# Patient Record
Sex: Male | Born: 1980 | Race: White | Hispanic: No | Marital: Single | Smoking: Never smoker
Health system: Southern US, Community
[De-identification: ages and names within clinical notes are randomized; demographics above are authoritative.]

## PROBLEM LIST (undated history)

## (undated) DIAGNOSIS — K589 Irritable bowel syndrome without diarrhea: Secondary | ICD-10-CM

## (undated) HISTORY — PX: COLONOSCOPY: SHX174

## (undated) HISTORY — PX: WISDOM TOOTH EXTRACTION: SHX21

## (undated) HISTORY — DX: Irritable bowel syndrome, unspecified: K58.9

---

## 2015-02-06 ENCOUNTER — Ambulatory Visit (INDEPENDENT_AMBULATORY_CARE_PROVIDER_SITE_OTHER): Payer: Worker's Compensation | Admitting: Emergency Medicine

## 2015-02-06 VITALS — BP 140/90 | HR 107 | Temp 99.0°F | Resp 16 | Ht 74.0 in | Wt 298.0 lb

## 2015-02-06 DIAGNOSIS — S61011A Laceration without foreign body of right thumb without damage to nail, initial encounter: Secondary | ICD-10-CM

## 2015-02-06 DIAGNOSIS — M79644 Pain in right finger(s): Secondary | ICD-10-CM

## 2015-02-06 NOTE — Progress Notes (Signed)
I directly supervised and participated in the laceration repair.  Verbal consent obtained from patient.  Local anesthesia with 4cc Lidocaine 2% without epinephrine.  Wound scrubbed with soap and water and rinsed.  Wound closed with #2 4-0 Prolene simple interrupted sutures.  Wound cleansed and dressed.

## 2015-02-06 NOTE — Progress Notes (Signed)
   Subjective:  Patient ID: Joseph Lucero, male    DOB: March 18, 1981  Age: 34 y.o. MRN: [id number]  CC: Work Related Injury   HPI Dujuan Stankowski presents  with a laceration to his right thumb that he sustained at work. He is not current on tetanus has no other injury. Has moderate pain in his thumb.  History  Past medical family and social history are unremarkable  Review of Systems   Review of systems noncontributory  Objective:  BP 140/90 mmHg  Pulse 107  Temp(Src) 99 F (37.2 C) (Oral)  Resp 16  Ht  (1.88 m)  Wt 298 lb (135.172 kg)  BMI 38.24 kg/m2  SpO2 99%  Physical Exam  Constitutional: He is oriented to person, place, and time. He appears well-developed and well-nourished. No distress.  HENT:  Head: Normocephalic and atraumatic.  Right Ear: External ear normal.  Left Ear: External ear normal.  Nose: Nose normal.  Eyes: Conjunctivae and EOM are normal. Pupils are equal, round, and reactive to light. No scleral icterus.  Neck: Normal range of motion. Neck supple. No tracheal deviation present.  Cardiovascular: Normal rate, regular rhythm and normal heart sounds.   Pulmonary/Chest: Effort normal. No respiratory distress. He has no wheezes. He has no rales.  Abdominal: He exhibits no mass. There is no tenderness. There is no rebound and no guarding.  Musculoskeletal: He exhibits no edema.  Lymphadenopathy:    He has no cervical adenopathy.  Neurological: He is alert and oriented to person, place, and time. Coordination normal.  Skin: Skin is warm and dry. No rash noted.  Psychiatric: He has a normal mood and affect. His behavior is normal.      Assessment & Plan:   Burhanuddin was seen today for work related injury.  Diagnoses and all orders for this visit:  Laceration of thumb, right, initial encounter  Thumb pain, right  Other orders -     Tdap vaccine greater than or equal to 7yo IM   I am having Mr. Vorndran maintain his LORazepam.  Meds ordered this  encounter  Medications  . LORazepam (ATIVAN) 0.5 MG tablet    Sig: Take 0.5 mg by mouth every 8 (eight) hours.    Appropriate red flag conditions were discussed with the patient as well as actions that should be taken.  Patient expressed his understanding.  Follow-up: Return in about 1 week (around 02/13/2015).  Carmelina Dane, MD

## 2015-02-06 NOTE — Progress Notes (Signed)
Laceration Repair  Verbal consent obtained. Digital block on right thumb with 4 cc 2% lidocaine without epinephrine. Wound scrubbed with soap and water. Wound closed with two simple interrupted 4-0 prolene sutures. Would cleansed and dressed.

## 2015-02-06 NOTE — Patient Instructions (Addendum)
Tdap Vaccine (Tetanus, Diphtheria, Pertussis): What You Need to Know 1. Why get vaccinated? Tetanus, diphtheria and pertussis can be very serious diseases, even for adolescents and adults. Tdap vaccine can protect us from these diseases. TETANUS (Lockjaw) causes painful muscle tightening and stiffness, usually all over the body.  It can lead to tightening of muscles in the head and neck so you can't open your mouth, swallow, or sometimes even breathe. Tetanus kills about 1 out of 5 people who are infected. DIPHTHERIA can cause a thick coating to form in the back of the throat.  It can lead to breathing problems, paralysis, heart failure, and death. PERTUSSIS (Whooping Cough) causes severe coughing spells, which can cause difficulty breathing, vomiting and disturbed sleep.  It can also lead to weight loss, incontinence, and rib fractures. Up to 2 in 100 adolescents and 5 in 100 adults with pertussis are hospitalized or have complications, which could include pneumonia or death. These diseases are caused by bacteria. Diphtheria and pertussis are spread from person to person through coughing or sneezing. Tetanus enters the body through cuts, scratches, or wounds. Before vaccines, the [place] saw as many as 200,000 cases a year of diphtheria and pertussis, and hundreds of cases of tetanus. Since vaccination began, tetanus and diphtheria have dropped by about 99% and pertussis by about 80%. 2. Tdap vaccine Tdap vaccine can protect adolescents and adults from tetanus, diphtheria, and pertussis. One dose of Tdap is routinely given at age 11 or 12. People who did not get Tdap at that age should get it as soon as possible. Tdap is especially important for health care professionals and anyone having close contact with a baby younger than 12 months. Pregnant women should get a dose of Tdap during every pregnancy, to protect the newborn from pertussis. Infants are most at risk for severe, life-threatening  complications from pertussis. A similar vaccine, called Td, protects from tetanus and diphtheria, but not pertussis. A Td booster should be given every 10 years. Tdap may be given as one of these boosters if you have not already gotten a dose. Tdap may also be given after a severe cut or burn to prevent tetanus infection. Your doctor can give you more information. Tdap may safely be given at the same time as other vaccines. 3. Some people should not get this vaccine  If you ever had a life-threatening allergic reaction after a dose of any tetanus, diphtheria, or pertussis containing vaccine, OR if you have a severe allergy to any part of this vaccine, you should not get Tdap. Tell your doctor if you have any severe allergies.  If you had a coma, or long or multiple seizures within 7 days after a childhood dose of DTP or DTaP, you should not get Tdap, unless a cause other than the vaccine was found. You can still get Td.  Talk to your doctor if you:  have epilepsy or another nervous system problem,  had severe pain or swelling after any vaccine containing diphtheria, tetanus or pertussis,  ever had Guillain-Barr Syndrome (GBS),  aren't feeling well on the day the shot is scheduled. 4. Risks of a vaccine reaction With any medicine, including vaccines, there is a chance of side effects. These are usually mild and go away on their own, but serious reactions are also possible. Brief fainting spells can follow a vaccination, leading to injuries from falling. Sitting or lying down for about 15 minutes can help prevent these. Tell your doctor if you feel   dizzy or light-headed, or have vision changes or ringing in the ears. Mild problems following Tdap (Did not interfere with activities)  Pain where the shot was given (about 3 in 4 adolescents or 2 in 3 adults)  Redness or swelling where the shot was given (about 1 person in 5)  Mild fever of at least 100.4F (up to about 1 in 25 adolescents or  1 in 100 adults)  Headache (about 3 or 4 people in 10)  Tiredness (about 1 person in 3 or 4)  Nausea, vomiting, diarrhea, stomach ache (up to 1 in 4 adolescents or 1 in 10 adults)  Chills, body aches, sore joints, rash, swollen glands (uncommon) Moderate problems following Tdap (Interfered with activities, but did not require medical attention)  Pain where the shot was given (about 1 in 5 adolescents or 1 in 100 adults)  Redness or swelling where the shot was given (up to about 1 in 16 adolescents or 1 in 25 adults)  Fever over 102F (about 1 in 100 adolescents or 1 in 250 adults)  Headache (about 3 in 20 adolescents or 1 in 10 adults)  Nausea, vomiting, diarrhea, stomach ache (up to 1 or 3 people in 100)  Swelling of the entire arm where the shot was given (up to about 3 in 100). Severe problems following Tdap (Unable to perform usual activities; required medical attention)  Swelling, severe pain, bleeding and redness in the arm where the shot was given (rare). A severe allergic reaction could occur after any vaccine (estimated less than 1 in a million doses). 5. What if there is a serious reaction? What should I look for?  Look for anything that concerns you, such as signs of a severe allergic reaction, very high fever, or behavior changes. Signs of a severe allergic reaction can include hives, swelling of the face and throat, difficulty breathing, a fast heartbeat, dizziness, and weakness. These would start a few minutes to a few hours after the vaccination. What should I do?  If you think it is a severe allergic reaction or other emergency that can't wait, call 9-1-1 or get the person to the nearest hospital. Otherwise, call your doctor.  Afterward, the reaction should be reported to the "Vaccine Adverse Event Reporting System" (VAERS). Your doctor might file this report, or you can do it yourself through the VAERS web site at www.vaers.hhs.gov, or by calling  1-800-822-7967. VAERS is only for reporting reactions. They do not give medical advice.  6. The National Vaccine Injury Compensation Program The National Vaccine Injury Compensation Program (VICP) is a federal program that was created to compensate people who may have been injured by certain vaccines. Persons who believe they may have been injured by a vaccine can learn about the program and about filing a claim by calling 1-800-338-2382 or visiting the VICP website at www.hrsa.gov/vaccinecompensation. 7. How can I learn more?  Ask your doctor.  Call your local or state health department.  Contact the Centers for Disease Control and Prevention (CDC):  Call 1-800-232-4636 or visit CDC's website at www.cdc.gov/vaccines. CDC Tdap Vaccine VIS (10/09/11) Document Released: 11/18/2011 Document Revised: 10/03/2013 Document Reviewed: 08/31/2013 ExitCare Patient Information 2015 [employer]. This information is not intended to replace advice given to you by your health care provider. Make sure you discuss any questions you have with your health care provider. Laceration Care, Adult A laceration is a cut or lesion that goes through all layers of the skin and into the tissue just beneath the   skin. TREATMENT  Some lacerations may not require closure. Some lacerations may not be able to be closed due to an increased risk of infection. It is important to see your caregiver as soon as possible after an injury to minimize the risk of infection and maximize the opportunity for successful closure. If closure is appropriate, pain medicines may be given, if needed. The wound will be cleaned to help prevent infection. Your caregiver will use stitches (sutures), staples, wound glue (adhesive), or skin adhesive strips to repair the laceration. These tools bring the skin edges together to allow for faster healing and a better cosmetic outcome. However, all wounds will heal with a scar. Once the wound has healed,  scarring can be minimized by covering the wound with sunscreen during the day for 1 full year. HOME CARE INSTRUCTIONS  For sutures or staples:  Keep the wound clean and dry.  If you were given a bandage (dressing), you should change it at least once a day. Also, change the dressing if it becomes wet or dirty, or as directed by your caregiver.  Wash the wound with soap and water 2 times a day. Rinse the wound off with water to remove all soap. Pat the wound dry with a clean towel.  After cleaning, apply a thin layer of the antibiotic ointment as recommended by your caregiver. This will help prevent infection and keep the dressing from sticking.  You may shower as usual after the first 24 hours. Do not soak the wound in water until the sutures are removed.  Only take over-the-counter or prescription medicines for pain, discomfort, or fever as directed by your caregiver.  Get your sutures or staples removed as directed by your caregiver. For skin adhesive strips:  Keep the wound clean and dry.  Do not get the skin adhesive strips wet. You may bathe carefully, using caution to keep the wound dry.  If the wound gets wet, pat it dry with a clean towel.  Skin adhesive strips will fall off on their own. You may trim the strips as the wound heals. Do not remove skin adhesive strips that are still stuck to the wound. They will fall off in time. For wound adhesive:  You may briefly wet your wound in the shower or bath. Do not soak or scrub the wound. Do not swim. Avoid periods of heavy perspiration until the skin adhesive has fallen off on its own. After showering or bathing, gently pat the wound dry with a clean towel.  Do not apply liquid medicine, cream medicine, or ointment medicine to your wound while the skin adhesive is in place. This may loosen the film before your wound is healed.  If a dressing is placed over the wound, be careful not to apply tape directly over the skin adhesive. This  may cause the adhesive to be pulled off before the wound is healed.  Avoid prolonged exposure to sunlight or tanning lamps while the skin adhesive is in place. Exposure to ultraviolet light in the first year will darken the scar.  The skin adhesive will usually remain in place for 5 to 10 days, then naturally fall off the skin. Do not pick at the adhesive film. You may need a tetanus shot if:  You cannot remember when you had your last tetanus shot.  You have never had a tetanus shot. If you get a tetanus shot, your arm may swell, get red, and feel warm to the touch. This is common and not   a problem. If you need a tetanus shot and you choose not to have one, there is a rare chance of getting tetanus. Sickness from tetanus can be serious. SEEK MEDICAL CARE IF:   You have redness, swelling, or increasing pain in the wound.  You see a red line that goes away from the wound.  You have yellowish-white fluid (pus) coming from the wound.  You have a fever.  You notice a bad smell coming from the wound or dressing.  Your wound breaks open before or after sutures have been removed.  You notice something coming out of the wound such as wood or glass.  Your wound is on your hand or foot and you cannot move a finger or toe. SEEK IMMEDIATE MEDICAL CARE IF:   Your pain is not controlled with prescribed medicine.  You have severe swelling around the wound causing pain and numbness or a change in color in your arm, hand, leg, or foot.  Your wound splits open and starts bleeding.  You have worsening numbness, weakness, or loss of function of any joint around or beyond the wound.  You develop painful lumps near the wound or on the skin anywhere on your body. MAKE SURE YOU:   Understand these instructions.  Will watch your condition.  Will get help right away if you are not doing well or get worse. Document Released: 05/19/2005 Document Revised: 08/11/2011 Document Reviewed:  11/12/2010 ExitCare Patient Information 2015 [employer]. This information is not intended to replace advice given to you by your health care provider. Make sure you discuss any questions you have with your health care provider.  

## 2015-02-13 ENCOUNTER — Ambulatory Visit (INDEPENDENT_AMBULATORY_CARE_PROVIDER_SITE_OTHER): Payer: Worker's Compensation | Admitting: Family Medicine

## 2015-02-13 VITALS — BP 149/94 | HR 98 | Temp 98.6°F | Resp 16 | Ht 73.0 in | Wt 297.4 lb

## 2015-02-13 DIAGNOSIS — S61011D Laceration without foreign body of right thumb without damage to nail, subsequent encounter: Secondary | ICD-10-CM

## 2015-02-13 NOTE — Patient Instructions (Signed)
Continue to keep your thumb clean and dry, protect against impact Return if you develop redness, swelling or drainage.

## 2015-02-13 NOTE — Progress Notes (Signed)
   Subjective:    Patient ID: Joseph Lucero, male    DOB: March 06, 1981, 34 y.o.   MRN: [id number]  HPI This is a pleasant 34 yo male who presents today for suture removal. He had 2 sutures placed 02/06/15 following a laceration with a razor blade while at work. He has not had any redness, swelling or drainage at the suture site.    Review of Systems Per HPI    Objective:   Physical Exam Physical Exam  Vitals reviewed. Constitutional: Oriented to person, place, and time. Appears well-developed and well-nourished.  HENT:  Head: Normocephalic and atraumatic.  Eyes: Conjunctivae are normal.  Neck: Normal range of motion. Neck supple.  Cardiovascular: Normal rate.   Pulmonary/Chest: Effort normal.  Musculoskeletal: Normal range of motion.  Neurological: Alert and oriented to person, place, and time.  Skin: Skin is warm and dry.  Psychiatric: Normal mood and affect. Behavior is normal. Judgment and thought content normal.   Right thumb- 2 sutures intact- removed. Wound edges well approximated, no erythema, no edema, no drainage.      Assessment & Plan:  1. Laceration of thumb, right, subsequent encounter - sutures removed, site unremarkable, RTC precautions given to patient.    Olean Ree, FNP-BC  [facility], [facility]  02/13/2015 1:56 PM

## 2017-05-13 ENCOUNTER — Emergency Department (HOSPITAL_COMMUNITY): Payer: 59

## 2017-05-13 ENCOUNTER — Encounter (HOSPITAL_COMMUNITY): Payer: Self-pay | Admitting: *Deleted

## 2017-05-13 ENCOUNTER — Encounter (HOSPITAL_COMMUNITY)
Admission: EM | Disposition: A | Discharge status: Home / Self Care | Payer: Self-pay | Source: Home / Self Care | Attending: Emergency Medicine

## 2017-05-13 ENCOUNTER — Emergency Department (HOSPITAL_COMMUNITY): Payer: 59 | Admitting: Registered Nurse

## 2017-05-13 ENCOUNTER — Other Ambulatory Visit: Payer: Self-pay

## 2017-05-13 ENCOUNTER — Observation Stay (HOSPITAL_COMMUNITY)
Admission: EM | Admit: 2017-05-13 | Discharge: 2017-05-14 | Disposition: A | Discharge status: Home / Self Care | Payer: 59 | Attending: Surgery | Admitting: Surgery

## 2017-05-13 DIAGNOSIS — K5730 Diverticulosis of large intestine without perforation or abscess without bleeding: Secondary | ICD-10-CM | POA: Diagnosis not present

## 2017-05-13 DIAGNOSIS — K3533 Acute appendicitis with perforation and localized peritonitis, with abscess: Principal | ICD-10-CM | POA: Insufficient documentation

## 2017-05-13 DIAGNOSIS — K439 Ventral hernia without obstruction or gangrene: Secondary | ICD-10-CM | POA: Diagnosis not present

## 2017-05-13 DIAGNOSIS — K3580 Unspecified acute appendicitis: Secondary | ICD-10-CM | POA: Diagnosis present

## 2017-05-13 DIAGNOSIS — R000 Tachycardia, unspecified: Secondary | ICD-10-CM | POA: Diagnosis not present

## 2017-05-13 DIAGNOSIS — Z9049 Acquired absence of other specified parts of digestive tract: Secondary | ICD-10-CM

## 2017-05-13 DIAGNOSIS — K760 Fatty (change of) liver, not elsewhere classified: Secondary | ICD-10-CM | POA: Diagnosis not present

## 2017-05-13 DIAGNOSIS — Z6838 Body mass index (BMI) 38.0-38.9, adult: Secondary | ICD-10-CM | POA: Insufficient documentation

## 2017-05-13 DIAGNOSIS — K589 Irritable bowel syndrome without diarrhea: Secondary | ICD-10-CM | POA: Insufficient documentation

## 2017-05-13 HISTORY — PX: LAPAROSCOPIC APPENDECTOMY: SHX408

## 2017-05-13 LAB — CBC WITH DIFFERENTIAL/PLATELET
Basophils Absolute: 0 10*3/uL (ref 0.0–0.1)
Basophils Relative: 0 %
EOS ABS: 0 10*3/uL (ref 0.0–0.7)
Eosinophils Relative: 0 %
HCT: 46.3 % (ref 39.0–52.0)
HEMOGLOBIN: 16.4 g/dL (ref 13.0–17.0)
LYMPHS ABS: 1.6 10*3/uL (ref 0.7–4.0)
LYMPHS PCT: 8 %
MCH: 31.7 pg (ref 26.0–34.0)
MCHC: 35.4 g/dL (ref 30.0–36.0)
MCV: 89.4 fL (ref 78.0–100.0)
MONOS PCT: 6 %
Monocytes Absolute: 1.4 10*3/uL — ABNORMAL HIGH (ref 0.1–1.0)
NEUTROS PCT: 86 %
Neutro Abs: 18.4 10*3/uL — ABNORMAL HIGH (ref 1.7–7.7)
Platelets: 286 10*3/uL (ref 150–400)
RBC: 5.18 MIL/uL (ref 4.22–5.81)
RDW: 12.3 % (ref 11.5–15.5)
WBC: 21.4 10*3/uL — ABNORMAL HIGH (ref 4.0–10.5)

## 2017-05-13 LAB — COMPREHENSIVE METABOLIC PANEL
ALK PHOS: 140 U/L — AB (ref 38–126)
ALT: 54 U/L (ref 17–63)
ANION GAP: 10 (ref 5–15)
AST: 31 U/L (ref 15–41)
Albumin: 4.3 g/dL (ref 3.5–5.0)
BILIRUBIN TOTAL: 0.7 mg/dL (ref 0.3–1.2)
BUN: 18 mg/dL (ref 6–20)
CO2: 23 mmol/L (ref 22–32)
CREATININE: 0.84 mg/dL (ref 0.61–1.24)
Calcium: 9.3 mg/dL (ref 8.9–10.3)
Chloride: 103 mmol/L (ref 101–111)
Glucose, Bld: 116 mg/dL — ABNORMAL HIGH (ref 65–99)
Potassium: 3.8 mmol/L (ref 3.5–5.1)
SODIUM: 136 mmol/L (ref 135–145)
Total Protein: 7.7 g/dL (ref 6.5–8.1)

## 2017-05-13 LAB — URINALYSIS, ROUTINE W REFLEX MICROSCOPIC
BILIRUBIN URINE: NEGATIVE
GLUCOSE, UA: NEGATIVE mg/dL
HGB URINE DIPSTICK: NEGATIVE
Ketones, ur: NEGATIVE mg/dL
Leukocytes, UA: NEGATIVE
Nitrite: NEGATIVE
PH: 5 (ref 5.0–8.0)
Protein, ur: NEGATIVE mg/dL

## 2017-05-13 LAB — LIPASE, BLOOD: Lipase: 22 U/L (ref 11–51)

## 2017-05-13 SURGERY — APPENDECTOMY, LAPAROSCOPIC
Anesthesia: General | Site: Abdomen

## 2017-05-13 MED ORDER — ONDANSETRON HCL 4 MG/2ML IJ SOLN
INTRAMUSCULAR | Status: DC | PRN
  Administered 2017-05-13: 4 mg via INTRAVENOUS

## 2017-05-13 MED ORDER — FENTANYL CITRATE (PF) 100 MCG/2ML IJ SOLN: 25.0000 ug | INTRAMUSCULAR | Status: DC | PRN

## 2017-05-13 MED ORDER — HEPARIN SODIUM (PORCINE) 5000 UNIT/ML IJ SOLN
5000.0000 [IU] | Freq: Three times a day (TID) | INTRAMUSCULAR | Status: DC
  Administered 2017-05-13: 5000 [IU] via SUBCUTANEOUS
  Filled 2017-05-13 (×2): qty 1

## 2017-05-13 MED ORDER — ONDANSETRON HCL 4 MG/2ML IJ SOLN: 4.0000 mg | Freq: Four times a day (QID) | INTRAMUSCULAR | Status: DC | PRN

## 2017-05-13 MED ORDER — ENOXAPARIN SODIUM 30 MG/0.3ML ~~LOC~~ SOLN: 30.0000 mg | Freq: Two times a day (BID) | SUBCUTANEOUS | Status: DC

## 2017-05-13 MED ORDER — IOPAMIDOL (ISOVUE-300) INJECTION 61%
100.0000 mL | Freq: Once | INTRAVENOUS | Status: AC | PRN
Start: 2017-05-13 — End: 2017-05-13
  Administered 2017-05-13: 100 mL via INTRAVENOUS

## 2017-05-13 MED ORDER — SUCCINYLCHOLINE CHLORIDE 200 MG/10ML IV SOSY
PREFILLED_SYRINGE | INTRAVENOUS | Status: DC | PRN
  Administered 2017-05-13: 140 mg via INTRAVENOUS

## 2017-05-13 MED ORDER — OXYCODONE HCL 5 MG PO TABS
5.0000 mg | ORAL_TABLET | Freq: Once | ORAL | Status: AC | PRN
  Administered 2017-05-13: 5 mg via ORAL

## 2017-05-13 MED ORDER — LIDOCAINE 2% (20 MG/ML) 5 ML SYRINGE
INTRAMUSCULAR | Status: DC | PRN
  Administered 2017-05-13: 60 mg via INTRAVENOUS

## 2017-05-13 MED ORDER — FENTANYL CITRATE (PF) 250 MCG/5ML IJ SOLN
INTRAMUSCULAR | Status: AC
  Filled 2017-05-13: qty 5

## 2017-05-13 MED ORDER — MORPHINE SULFATE (PF) 2 MG/ML IV SOLN: 2.0000 mg | INTRAVENOUS | Status: DC | PRN

## 2017-05-13 MED ORDER — KCL IN DEXTROSE-NACL 20-5-0.45 MEQ/L-%-% IV SOLN
INTRAVENOUS | Status: DC
  Administered 2017-05-13: 18:00:00 via INTRAVENOUS
  Administered 2017-05-13 – 2017-05-14 (×2): 100 mL/h via INTRAVENOUS
  Filled 2017-05-13 (×2): qty 1000

## 2017-05-13 MED ORDER — ONDANSETRON 4 MG PO TBDP: 4.0000 mg | ORAL_TABLET | Freq: Four times a day (QID) | ORAL | Status: DC | PRN

## 2017-05-13 MED ORDER — ROCURONIUM BROMIDE 10 MG/ML (PF) SYRINGE
PREFILLED_SYRINGE | INTRAVENOUS | Status: DC | PRN
  Administered 2017-05-13: 5 mg via INTRAVENOUS
  Administered 2017-05-13: 40 mg via INTRAVENOUS

## 2017-05-13 MED ORDER — IOPAMIDOL (ISOVUE-300) INJECTION 61%
INTRAVENOUS | Status: AC
  Filled 2017-05-13: qty 100

## 2017-05-13 MED ORDER — PIPERACILLIN-TAZOBACTAM 3.375 G IVPB
3.3750 g | Freq: Three times a day (TID) | INTRAVENOUS | Status: DC
  Filled 2017-05-13: qty 50

## 2017-05-13 MED ORDER — PROPOFOL 10 MG/ML IV BOLUS
INTRAVENOUS | Status: DC | PRN
  Administered 2017-05-13: 200 mg via INTRAVENOUS

## 2017-05-13 MED ORDER — MIDAZOLAM HCL 2 MG/2ML IJ SOLN
INTRAMUSCULAR | Status: AC
  Filled 2017-05-13: qty 2

## 2017-05-13 MED ORDER — KCL IN DEXTROSE-NACL 20-5-0.45 MEQ/L-%-% IV SOLN
INTRAVENOUS | Status: AC
  Filled 2017-05-13: qty 1000

## 2017-05-13 MED ORDER — OXYCODONE HCL 5 MG PO TABS
ORAL_TABLET | ORAL | Status: AC
  Filled 2017-05-13: qty 1

## 2017-05-13 MED ORDER — SUGAMMADEX SODIUM 200 MG/2ML IV SOLN
INTRAVENOUS | Status: DC | PRN
  Administered 2017-05-13: 200 mg via INTRAVENOUS

## 2017-05-13 MED ORDER — HYDROCODONE-ACETAMINOPHEN 5-325 MG PO TABS
1.0000 | ORAL_TABLET | ORAL | Status: DC | PRN
  Administered 2017-05-13 (×2): 1 via ORAL
  Filled 2017-05-13: qty 2

## 2017-05-13 MED ORDER — SODIUM CHLORIDE 0.9 % IV BOLUS (SEPSIS)
1000.0000 mL | Freq: Once | INTRAVENOUS | Status: AC
  Administered 2017-05-13: 1000 mL via INTRAVENOUS

## 2017-05-13 MED ORDER — MORPHINE SULFATE (PF) 4 MG/ML IV SOLN
4.0000 mg | Freq: Once | INTRAVENOUS | Status: AC
  Administered 2017-05-13: 4 mg via INTRAVENOUS
  Filled 2017-05-13: qty 1

## 2017-05-13 MED ORDER — DIPHENHYDRAMINE HCL 50 MG/ML IJ SOLN: 25.0000 mg | Freq: Four times a day (QID) | INTRAMUSCULAR | Status: DC | PRN

## 2017-05-13 MED ORDER — PIPERACILLIN-TAZOBACTAM 3.375 G IVPB 30 MIN
3.3750 g | Freq: Three times a day (TID) | INTRAVENOUS | Status: AC
  Administered 2017-05-13: 3.375 g via INTRAVENOUS
  Filled 2017-05-13 (×2): qty 50

## 2017-05-13 MED ORDER — FENTANYL CITRATE (PF) 250 MCG/5ML IJ SOLN
INTRAMUSCULAR | Status: DC | PRN
  Administered 2017-05-13: 75 ug via INTRAVENOUS
  Administered 2017-05-13: 50 ug via INTRAVENOUS
  Administered 2017-05-13: 100 ug via INTRAVENOUS
  Administered 2017-05-13: 25 ug via INTRAVENOUS

## 2017-05-13 MED ORDER — DIPHENHYDRAMINE HCL 25 MG PO CAPS: 25.0000 mg | ORAL_CAPSULE | Freq: Four times a day (QID) | ORAL | Status: DC | PRN

## 2017-05-13 MED ORDER — PROPOFOL 10 MG/ML IV BOLUS
INTRAVENOUS | Status: AC
  Filled 2017-05-13: qty 20

## 2017-05-13 MED ORDER — OXYCODONE HCL 5 MG/5ML PO SOLN
5.0000 mg | Freq: Once | ORAL | Status: AC | PRN
  Filled 2017-05-13: qty 5

## 2017-05-13 MED ORDER — POTASSIUM CHLORIDE 2 MEQ/ML IV SOLN
INTRAVENOUS | Status: DC
  Filled 2017-05-13: qty 1000

## 2017-05-13 MED ORDER — DEXAMETHASONE SODIUM PHOSPHATE 10 MG/ML IJ SOLN
INTRAMUSCULAR | Status: DC | PRN
  Administered 2017-05-13: 10 mg via INTRAVENOUS

## 2017-05-13 MED ORDER — RINGERS IRRIGATION IR SOLN
Status: DC | PRN
  Administered 2017-05-13: 1000 mL

## 2017-05-13 MED ORDER — PIPERACILLIN-TAZOBACTAM 3.375 G IVPB 30 MIN
3.3750 g | Freq: Once | INTRAVENOUS | Status: AC
  Administered 2017-05-13: 3.375 g via INTRAVENOUS
  Filled 2017-05-13: qty 50

## 2017-05-13 MED ORDER — SODIUM CHLORIDE 0.9 % IR SOLN
Status: DC | PRN
  Administered 2017-05-13: 1000 mL

## 2017-05-13 MED ORDER — MIDAZOLAM HCL 2 MG/2ML IJ SOLN
INTRAMUSCULAR | Status: DC | PRN
  Administered 2017-05-13: 2 mg via INTRAVENOUS

## 2017-05-13 MED ORDER — BUPIVACAINE LIPOSOME 1.3 % IJ SUSP
20.0000 mL | Freq: Once | INTRAMUSCULAR | Status: AC
  Administered 2017-05-13: 20 mL
  Filled 2017-05-13: qty 20

## 2017-05-13 MED ORDER — LACTATED RINGERS IV SOLN
INTRAVENOUS | Status: DC | PRN
  Administered 2017-05-13 (×2): via INTRAVENOUS

## 2017-05-13 SURGICAL SUPPLY — 36 items
APPLIER CLIP ROT 10 11.4 M/L (STAPLE)
CABLE HIGH FREQUENCY MONO STRZ (ELECTRODE) IMPLANT
CHLORAPREP W/TINT 26ML (MISCELLANEOUS) IMPLANT
CLIP APPLIE ROT 10 11.4 M/L (STAPLE) IMPLANT
COVER SURGICAL LIGHT HANDLE (MISCELLANEOUS) ×3 IMPLANT
CUTTER FLEX LINEAR 45M (STAPLE) ×3 IMPLANT
DECANTER SPIKE VIAL GLASS SM (MISCELLANEOUS) IMPLANT
DERMABOND ADVANCED (GAUZE/BANDAGES/DRESSINGS) ×2
DERMABOND ADVANCED .7 DNX12 (GAUZE/BANDAGES/DRESSINGS) ×1 IMPLANT
DRAPE LAPAROSCOPIC ABDOMINAL (DRAPES) ×3 IMPLANT
ELECT REM PT RETURN 15FT ADLT (MISCELLANEOUS) ×3 IMPLANT
ENDOLOOP SUT PDS II  0 18 (SUTURE)
ENDOLOOP SUT PDS II 0 18 (SUTURE) IMPLANT
GLOVE BIOGEL M 8.0 STRL (GLOVE) ×3 IMPLANT
GOWN STRL REUS W/TWL XL LVL3 (GOWN DISPOSABLE) ×6 IMPLANT
KIT BASIN OR (CUSTOM PROCEDURE TRAY) ×3 IMPLANT
PAD POSITIONING PINK XL (MISCELLANEOUS) ×3 IMPLANT
POUCH RETRIEVAL ECOSAC 10 (ENDOMECHANICALS) ×1 IMPLANT
POUCH RETRIEVAL ECOSAC 10MM (ENDOMECHANICALS) ×2
POUCH SPECIMEN RETRIEVAL 10MM (ENDOMECHANICALS) IMPLANT
RELOAD 45 VASCULAR/THIN (ENDOMECHANICALS) ×3 IMPLANT
RELOAD STAPLE TA45 3.5 REG BLU (ENDOMECHANICALS) IMPLANT
SCISSORS LAP 5X45 EPIX DISP (ENDOMECHANICALS) IMPLANT
SET IRRIG TUBING LAPAROSCOPIC (IRRIGATION / IRRIGATOR) ×3 IMPLANT
SHEARS HARMONIC ACE PLUS 45CM (MISCELLANEOUS) ×3 IMPLANT
SLEEVE XCEL OPT CAN 5 100 (ENDOMECHANICALS) IMPLANT
STAPLER VISISTAT 35W (STAPLE) IMPLANT
SUT MNCRL AB 4-0 PS2 18 (SUTURE) ×3 IMPLANT
TOWEL OR 17X26 10 PK STRL BLUE (TOWEL DISPOSABLE) ×3 IMPLANT
TRAY FOLEY W/METER SILVER 14FR (SET/KITS/TRAYS/PACK) ×3 IMPLANT
TRAY FOLEY W/METER SILVER 16FR (SET/KITS/TRAYS/PACK) ×3 IMPLANT
TRAY LAPAROSCOPIC (CUSTOM PROCEDURE TRAY) ×3 IMPLANT
TROCAR BLADELESS OPT 5 100 (ENDOMECHANICALS) ×3 IMPLANT
TROCAR XCEL BLUNT TIP 100MML (ENDOMECHANICALS) ×3 IMPLANT
TROCAR XCEL NON-BLD 11X100MML (ENDOMECHANICALS) IMPLANT
TUBING INSUF HEATED (TUBING) ×3 IMPLANT

## 2017-05-13 NOTE — Discharge Instructions (Signed)
Laparoscopic Appendectomy, Adult, Care After °Refer to this sheet in the next few weeks. These instructions provide you with information about caring for yourself after your procedure. Your health care provider may also give you more specific instructions. Your treatment has been planned according to current medical practices, but problems sometimes occur. Call your health care provider if you have any problems or questions after your procedure. °What can I expect after the procedure? °After the procedure, it is common to have: °· A decrease in your energy level. °· Mild pain in the area where the surgical cuts (incisions) were made. °· Constipation. This can be caused by pain medicine and a decrease in your activity. ° °Follow these instructions at home: °Medicines °· Take over-the-counter and prescription medicines only as told by your health care provider. °· Do not drive for 24 hours if you received a sedative. °· Do not drive or operate heavy machinery while taking prescription pain medicine. °· If you were prescribed an antibiotic medicine, take it as told by your health care provider. Do not stop taking the antibiotic even if you start to feel better. °Activity °· For 3 weeks or as long as told by your health care provider: °? Do not lift anything that is heavier than 10 pounds (4.5 kg). °? Do not play contact sports. °· Gradually return to your normal activities. Ask your health care provider what activities are safe for you. °Bathing °· Keep your incisions clean and dry. Clean them as often as told by your health care provider: °? Gently wash the incisions with soap and water. °? Rinse the incisions with water to remove all soap. °? Pat the incisions dry with a clean towel. Do not rub the incisions. °· You may take showers after 48 hours. °· Do not take baths, swim, or use hot tubs for 2 weeks or as told by your health care provider. °Incision care °· Follow instructions from your healthcare provider about  how to take care of your incisions. Make sure you: °? Wash your hands with soap and water before you change your bandage (dressing). If soap and water are not available, use hand sanitizer. °? Change your dressing as told by your health care provider. °? Leave stitches (sutures), skin glue, or adhesive strips in place. These skin closures may need to stay in place for 2 weeks or longer. If adhesive strip edges start to loosen and curl up, you may trim the loose edges. Do not remove adhesive strips completely unless your health care provider tells you to do that. °· Check your incision areas every day for signs of infection. Check for: °? More redness, swelling, or pain. °? More fluid or blood. °? Warmth. °? Pus or a bad smell. °Other Instructions °· If you were sent home with a drain, follow instructions from your health care provider about how to care for the drain and how to empty it. °· Take deep breaths. This helps to prevent your lungs from becoming inflamed. °· To relieve and prevent constipation: °? Drink plenty of fluids. °? Eat plenty of fruits and vegetables. °· Keep all follow-up visits as told by your health care provider. This is important. °Contact a health care provider if: °· You have more redness, swelling, or pain around an incision. °· You have more fluid or blood coming from an incision. °· Your incision feels warm to the touch. °· You have pus or a bad smell coming from an incision or dressing. °· Your incision   edges break open after your sutures have been removed. °· You have increasing pain in your shoulders. °· You feel dizzy or you faint. °· You develop shortness of breath. °· You keep feeling nauseous or vomiting. °· You have diarrhea or you cannot control your bowel functions. °· You lose your appetite. °· You develop swelling or pain in your legs. °Get help right away if: °· You have a fever. °· You develop a rash. °· You have difficulty breathing. °· You have sharp pains in your  chest. °This information is not intended to replace advice given to you by your health care provider. Make sure you discuss any questions you have with your health care provider. °Document Released: 05/19/2005 Document Revised: 10/19/2015 Document Reviewed: 11/06/2014 °Elsevier Interactive Patient Education © 2017 [employer] ° °CCS ______[facility] °LAPAROSCOPIC SURGERY: POST OP INSTRUCTIONS °Always review your discharge instruction sheet given to you by the facility where your surgery was performed. °IF YOU HAVE DISABILITY OR FAMILY LEAVE FORMS, YOU MUST BRING THEM TO THE OFFICE FOR PROCESSING.   °DO NOT GIVE THEM TO YOUR DOCTOR. ° °1. A prescription for pain medication may be given to you upon discharge.  Take your pain medication as prescribed, if needed.  If narcotic pain medicine is not needed, then you may take acetaminophen (Tylenol) or ibuprofen (Advil) as needed. °2. Take your usually prescribed medications unless otherwise directed. °3. If you need a refill on your pain medication, please contact your pharmacy.  They will contact our office to request authorization. Prescriptions will not be filled after 5pm or on week-ends. °4. You should follow a light diet the first few days after arrival home, such as soup and crackers, etc.  Be sure to include lots of fluids daily. °5. Most patients will experience some swelling and bruising in the area of the incisions.  Ice packs will help.  Swelling and bruising can take several days to resolve.  °6. It is common to experience some constipation if taking pain medication after surgery.  Increasing fluid intake and taking a stool softener (such as Colace) will usually help or prevent this problem from occurring.  A mild laxative (Milk of Magnesia or Miralax) should be taken according to package instructions if there are no bowel movements after 48 hours. °7. Unless discharge instructions indicate otherwise, you may remove your bandages 24-48  hours after surgery, and you may shower at that time.  You may have steri-strips (small skin tapes) in place directly over the incision.  These strips should be left on the skin for 7-10 days.  If your surgeon used skin glue on the incision, you may shower in 24 hours.  The glue will flake off over the next 2-3 weeks.  Any sutures or staples will be removed at the office during your follow-up visit. °8. ACTIVITIES:  You may resume regular (light) daily activities beginning the next day--such as daily self-care, walking, climbing stairs--gradually increasing activities as tolerated.  You may have sexual intercourse when it is comfortable.  Refrain from any heavy lifting or straining until approved by your doctor. °a. You may drive when you are no longer taking prescription pain medication, you can comfortably wear a seatbelt, and you can safely maneuver your car and apply brakes. °b. RETURN TO WORK:  __________________________________________________________ °9. You should see your doctor in the office for a follow-up appointment approximately 2-3 weeks after your surgery.  Make sure that you call for this appointment within a day or   two after you arrive home to insure a convenient appointment time. °10. OTHER INSTRUCTIONS: __________________________________________________________________________________________________________________________ __________________________________________________________________________________________________________________________ °WHEN TO CALL YOUR DOCTOR: °1. Fever over 101.0 °2. Inability to urinate °3. Continued bleeding from incision. °4. Increased pain, redness, or drainage from the incision. °5. Increasing abdominal pain ° °The clinic staff is available to answer your questions during regular business hours.  Please don’t hesitate to call and ask to speak to one of the nurses for clinical concerns.  If you have a medical emergency, go to the nearest emergency room or call  911.  A surgeon from [facility] is always on call at the hospital. °[address], [address], [place], [place]  [address] ? [address] [address], [place], [place]   [address] °[phone] ? [phone] ? FAX [phone] °Web site: [email or web address] ° °

## 2017-05-13 NOTE — ED Notes (Signed)
Patient transported to CT 

## 2017-05-13 NOTE — Anesthesia Preprocedure Evaluation (Addendum)
Anesthesia Evaluation  Patient identified by MRN, date of birth, ID band Patient awake    Reviewed: Allergy & Precautions, NPO status , Patient's Chart, lab work & pertinent test results  History of Anesthesia Complications Negative for: history of anesthetic complications  Airway Mallampati: II  TM Distance: >3 FB Neck ROM: Full    Dental  (+) Teeth Intact, Dental Advisory Given, Chipped   Pulmonary neg pulmonary ROS,    breath sounds clear to auscultation       Cardiovascular negative cardio ROS   Rhythm:Regular     Neuro/Psych negative neurological ROS  negative psych ROS   GI/Hepatic Neg liver ROS, appendicitis   Endo/Other  Morbid obesity  Renal/GU negative Renal ROS     Musculoskeletal   Abdominal   Peds  Hematology negative hematology ROS (+)   Anesthesia Other Findings   Reproductive/Obstetrics                            Anesthesia Physical Anesthesia Plan  ASA: II  Anesthesia Plan: General   Post-op Pain Management:    Induction: Intravenous  PONV Risk Score and Plan: 3 and Ondansetron, Dexamethasone and Treatment may vary due to age or medical condition  Airway Management Planned: Oral ETT  Additional Equipment: None  Intra-op Plan:   Post-operative Plan: Extubation in OR  Informed Consent: I have reviewed the patients History and Physical, chart, labs and discussed the procedure including the risks, benefits and alternatives for the proposed anesthesia with the patient or authorized representative who has indicated his/her understanding and acceptance.   Dental advisory given  Plan Discussed with: CRNA and Surgeon  Anesthesia Plan Comments:         Anesthesia Quick Evaluation

## 2017-05-13 NOTE — Progress Notes (Signed)
Pharmacy Antibiotic Note  Joseph Lucero is a 36 y.o. male presented to the ED on 05/13/2017 with c/o abd pain.  Abd CT showed acute appendicitis with plan for an appendectomy later today (12/12).  To start zosyn for empiric coverage.  - scr 0.84   Plan: - zosyn 3.375 gm IV q8h (infuse over 4 hrs) - f/u with plan for abx after surgical procedure  ____________________________________     Temp (24hrs), Avg:98.6 F (37 C), Min:98.6 F (37 C), Max:98.6 F (37 C)  Recent Labs  Lab 05/13/17 0841  WBC 21.4*  CREATININE 0.84    CrCl cannot be calculated (Unknown ideal weight.).    No Known Allergies   Thank you for allowing pharmacy to be a part of this patient's care.  Lucia Gaskinsham, Naviah Belfield P 05/13/2017 12:22 PM

## 2017-05-13 NOTE — Transfer of Care (Signed)
Immediate Anesthesia Transfer of Care Note  Patient: Joseph Lucero  Procedure(s) Performed: APPENDECTOMY LAPAROSCOPIC (N/A Abdomen)  Patient Location: PACU  Anesthesia Type:General  Level of Consciousness: awake and alert   Airway & Oxygen Therapy: Patient Spontanous Breathing and Patient connected to face mask oxygen  Post-op Assessment: Report given to RN and Post -op Vital signs reviewed and stable  Post vital signs: Reviewed and stable  Last Vitals:  Vitals:   05/13/17 0811 05/13/17 1045  BP: 136/87 (!) 141/88  Pulse: (!) 120 89  Resp: 18 16  Temp: 37 C   SpO2: 99% 99%    Last Pain:  Vitals:   05/13/17 0940  TempSrc:   PainSc: 5          Complications: No apparent anesthesia complications

## 2017-05-13 NOTE — ED Notes (Signed)
Belonging taken to PACU per Chari ManningBecca.

## 2017-05-13 NOTE — Anesthesia Procedure Notes (Signed)
Procedure Name: Intubation Date/Time: 05/13/2017 1:32 PM Performed by: Minerva EndsMirarchi, Sache Sane M, CRNA Pre-anesthesia Checklist: Patient identified, Emergency Drugs available, Suction available and Patient being monitored Patient Re-evaluated:Patient Re-evaluated prior to induction Oxygen Delivery Method: Circle System Utilized Preoxygenation: Pre-oxygenation with 100% oxygen Induction Type: IV induction Ventilation: Mask ventilation without difficulty Laryngoscope Size: Miller and 2 Grade View: Grade I Tube type: Oral Tube size: 7.0 mm Number of attempts: 1 Airway Equipment and Method: Stylet Placement Confirmation: ETT inserted through vocal cords under direct vision,  positive ETCO2 and breath sounds checked- equal and bilateral Secured at: 22 cm Tube secured with: Tape Dental Injury: Teeth and Oropharynx as per pre-operative assessment  Comments: Smooth IV induction Hollis-- intubation AM CRNA atraumatic-- teeth and mouth as preop ( chipped teeth reported preop) bilat BS Hollis

## 2017-05-13 NOTE — Anesthesia Postprocedure Evaluation (Signed)
Anesthesia Post Note  Patient: Joseph Lucero  Procedure(s) Performed: APPENDECTOMY LAPAROSCOPIC (N/A Abdomen)     Patient location during evaluation: PACU Anesthesia Type: General Level of consciousness: awake and sedated Pain management: pain level controlled Vital Signs Assessment: post-procedure vital signs reviewed and stable Respiratory status: spontaneous breathing Cardiovascular status: stable Postop Assessment: no apparent nausea or vomiting Anesthetic complications: no    Last Vitals:  Vitals:   05/13/17 1545 05/13/17 1630  BP: 129/82   Pulse: 81   Resp: 12   Temp: 37.2 C 37.2 C  SpO2: 100%     Last Pain:  Vitals:   05/13/17 1545  TempSrc:   PainSc: Asleep   Pain Goal:                 Haja Crego JR,JOHN Suriya Kovarik

## 2017-05-13 NOTE — ED Provider Notes (Signed)
[facility] Provider Note   CSN: [id number] Arrival date & time: 05/13/17  0801     History   Chief Complaint Chief Complaint  Patient presents with  . Abdominal Pain    HPI Joseph Lucero is a 36 y.o. male.  HPI Patient went to bed at 930 yesterday evening.  States he was symptom-free at that time.  He woke at 1230 with sharp "stabbing" right lower quadrant pain.  Associated with nausea and 2 episodes of vomiting.  States the pain is persisted through the night.  Pain is worse when lying on his right side.  Denies any fever or chills.  Denies anorexia.  No urinary symptoms.  Had normal bowel movement this morning.  No blood in the stool. Past Medical History:  Diagnosis Date  . IBS (irritable bowel syndrome)     Patient Active Problem List   Diagnosis Date Noted  . Acute appendicitis 05/13/2017    Past Surgical History:  Procedure Laterality Date  . COLONOSCOPY    . WISDOM TOOTH EXTRACTION         Home Medications    Prior to Admission medications   Medication Sig Start Date End Date Taking? Authorizing Provider  ibuprofen (ADVIL,MOTRIN) 200 MG tablet Take 400 mg by mouth every 6 (six) hours as needed for fever, headache, mild pain, moderate pain or cramping.   Yes [provider]    Family History Family History  Problem Relation Age of Onset  . Diabetes Father     Social History Social History   Tobacco Use  . Smoking status: Never Smoker  . Smokeless tobacco: Never Used  Substance Use Topics  . Alcohol use: Yes    Alcohol/week: 0.0 oz  . Drug use: Not on file     Allergies   Patient has no known allergies.   Review of Systems Review of Systems  Constitutional: Negative for chills and fever.  Respiratory: Negative for cough and shortness of breath.   Cardiovascular: Negative for chest pain and palpitations.  Gastrointestinal: Positive for abdominal pain, nausea and vomiting. Negative for constipation and  diarrhea.  Genitourinary: Negative for dysuria, flank pain and frequency.  Musculoskeletal: Negative for back pain, myalgias and neck pain.  Skin: Negative for rash and wound.  Neurological: Negative for dizziness, weakness, light-headedness, numbness and headaches.  All other systems reviewed and are negative.    Physical Exam Updated Vital Signs BP 129/82   Pulse 81   Temp 98.9 F (37.2 C)   Resp 12   SpO2 100%   Physical Exam  Constitutional: He is oriented to person, place, and time. He appears well-developed and well-nourished.  Non-toxic appearance. He does not appear ill. No distress.  HENT:  Head: Normocephalic and atraumatic.  Mouth/Throat: Oropharynx is clear and moist.  Eyes: EOM are normal. Pupils are equal, round, and reactive to light.  Neck: Normal range of motion. Neck supple.  Cardiovascular: Regular rhythm. Exam reveals no gallop and no friction rub.  No murmur heard. Tachycardia  Pulmonary/Chest: Effort normal and breath sounds normal. No stridor. No respiratory distress. He has no wheezes. He has no rales. He exhibits no tenderness.  Abdominal: Soft. Bowel sounds are normal. There is tenderness. There is no rebound and no guarding.  Patient has point tenderness in the right lower quadrant.  There is no rebound or guarding.  Musculoskeletal: Normal range of motion. He exhibits no edema or tenderness.  No CVA tenderness bilaterally.  Neurological: He is alert and  oriented to person, place, and time.  Skin: Skin is warm and dry. Capillary refill takes less than 2 seconds. No rash noted. He is not diaphoretic. No erythema.  Psychiatric: He has a normal mood and affect. His behavior is normal.  Nursing note and vitals reviewed.    ED Treatments / Results  Labs (all labs ordered are listed, but only abnormal results are displayed) Labs Reviewed  CBC WITH DIFFERENTIAL/PLATELET - Abnormal; Notable for the following components:      Result Value   WBC 21.4 (*)     Neutro Abs 18.4 (*)    Monocytes Absolute 1.4 (*)    All other components within normal limits  COMPREHENSIVE METABOLIC PANEL - Abnormal; Notable for the following components:   Glucose, Bld 116 (*)    Alkaline Phosphatase 140 (*)    All other components within normal limits  URINALYSIS, ROUTINE W REFLEX MICROSCOPIC - Abnormal; Notable for the following components:   Specific Gravity, Urine >1.046 (*)    All other components within normal limits  LIPASE, BLOOD  HIV ANTIBODY (ROUTINE TESTING)  CBC  CREATININE, SERUM  SURGICAL PATHOLOGY    EKG  EKG Interpretation None       Radiology Ct Abdomen Pelvis W Contrast  Result Date: 05/13/2017 CLINICAL DATA:  Right lower quadrant pain with nausea and vomiting EXAM: CT ABDOMEN AND PELVIS WITH CONTRAST TECHNIQUE: Multidetector CT imaging of the abdomen and pelvis was performed using the standard protocol following bolus administration of intravenous contrast. CONTRAST:  ISOVUE-300 IOPAMIDOL (ISOVUE-300) INJECTION 61% COMPARISON:  None. FINDINGS: Lower chest: There is slight bibasilar atelectasis. Lung bases otherwise are clear. Hepatobiliary: There is hepatic steatosis. No focal liver lesions are evident. Gallbladder wall is not appreciably thickened. There is no biliary duct dilatation. Pancreas: No pancreatic mass or inflammatory focus. Spleen: Spleen measures 14.0 x 14.1 x 5.5 cm with a measured splenic volume of 543 cubic cm. No splenic lesions are evident. Adrenals/Urinary Tract: Adrenals appear normal bilaterally. Kidneys bilaterally show no evident mass or hydronephrosis on either side. There is no renal or ureteral calculus on either side. Urinary bladder is midline with wall thickness within normal limits. Stomach/Bowel: There are multiple sigmoid diverticulum without diverticulitis. There also scattered diverticula in the descending colon. There is no bowel wall o thickening. No bowel obstruction. No free air or portal venous  air. Vascular/Lymphatic: No abdominal aortic aneurysm. No vascular lesions are evident. There is no adenopathy in the abdomen or pelvis. Reproductive: Prostate and seminal vesicles are normal in size and contour. No pelvic mass evident. Other: The appendix is dilated with a maximum transverse diameter of 15 mm. There is mild enhancement of the appendiceal wall. There is stranding along the course of the appendix. There is no fluid collection or abscess. No perforation. No abscess or ascites is evident in the abdomen or pelvis. There is a minimal ventral hernia containing only fat. Musculoskeletal: There are no blastic or lytic bone lesions. There is no intramuscular or abdominal wall lesion. IMPRESSION: 1.  Findings indicative of acute appendicitis. Appendix: Location: Right lower quadrant immediately anterior to the psoas muscle with appendix transverse in orientation with the tip near the distal right common iliac artery. Diameter: 15 mm Appendicolith: None Mucosal hyper-enhancement: Mild diffusely Extraluminal gas: No Periappendiceal collection: None. There is stranding in the soft tissues along the course of the appendix. 2. Multiple descending colon and sigmoid diverticula without diverticulitis. No bowel obstruction. No abscess. 3.  No renal or ureteral calculus.  No hydronephrosis. 4.  Prominent spleen of uncertain etiology. 5.  Hepatic steatosis. 6.  Rather minimal ventral hernia containing only fat. Critical Value/emergent results were called by telephone at the time of interpretation on 05/13/2017 at 10:54 am to Dr. Loren RacerAVID Idaly Verret , who verbally acknowledged these results. Electronically Signed   By: Bretta BangWilliam  Woodruff III M.D.   On: 05/13/2017 10:55    Procedures Procedures (including critical care time)  Medications Ordered in ED Medications  iopamidol (ISOVUE-300) 61 % injection (not administered)  fentaNYL (SUBLIMAZE) injection 25-50 mcg (not administered)  oxyCODONE (Oxy IR/ROXICODONE)  immediate release tablet 5 mg (not administered)    Or  oxyCODONE (ROXICODONE) 5 MG/5ML solution 5 mg (not administered)  enoxaparin (LOVENOX) injection 30 mg ( Subcutaneous Automatically Held 05/21/17 1215)  lactated ringers 1,000 mL with potassium chloride 20 mEq infusion (not administered)  diphenhydrAMINE (BENADRYL) capsule 25 mg ( Oral MAR Hold 05/13/17 1247)    Or  diphenhydrAMINE (BENADRYL) injection 25 mg ( Intravenous MAR Hold 05/13/17 1247)  morphine 2 MG/ML injection 2-4 mg ( Intravenous MAR Hold 05/13/17 1247)  ondansetron (ZOFRAN-ODT) disintegrating tablet 4 mg ( Oral MAR Hold 05/13/17 1247)    Or  ondansetron (ZOFRAN) injection 4 mg ( Intravenous MAR Hold 05/13/17 1247)  piperacillin-tazobactam (ZOSYN) IVPB 3.375 g ( Intravenous Automatically Held 05/21/17 1800)  morphine 4 MG/ML injection 4 mg (4 mg Intravenous Given 05/13/17 0848)  sodium chloride 0.9 % bolus 1,000 mL (0 mLs Intravenous Stopped 05/13/17 1046)  iopamidol (ISOVUE-300) 61 % injection 100 mL (100 mLs Intravenous Contrast Given 05/13/17 1032)  piperacillin-tazobactam (ZOSYN) IVPB 3.375 g (3.375 g Intravenous New Bag/Given 05/13/17 1243)  bupivacaine liposome (EXPAREL) 1.3 % injection 266 mg (20 mLs Infiltration Given 05/13/17 1414)     Initial Impression / Assessment and Plan / ED Course  I have reviewed the triage vital signs and the nursing notes.  Pertinent labs & imaging results that were available during my care of the patient were reviewed by me and considered in my medical decision making (see chart for details).     CT with evidence of uncomplicated appendicitis.  Discussed with general surgery.  Will see patient in the emergency department and admit.  Final Clinical Impressions(s) / ED Diagnoses   Final diagnoses:  Acute appendicitis, unspecified acute appendicitis type    ED Discharge Orders    None       Loren RacerYelverton, Gilman Olazabal, MD 05/13/17 1553

## 2017-05-13 NOTE — Op Note (Signed)
Surgeon: Wenda LowMatt Aneyah Lortz, MD, FACS  Asst:  none  Anes:  general  Preop Dx: Acute appendicitis Postop Dx: same  Procedure: Laparoscopic appendectomy Location Surgery: [facility] 4 Complications: none  EBL:   minimal cc  Drains: none  Description of Procedure:  The patient was taken to OR 4 .  After anesthesia was administered and the patient was prepped a timeout was performed.  Access was achieved with a Hasson technique through the umbilicus.  Following insufflation, two other ports were placed (5 mm) in the right upper and left lower quadrants.  The appendix was long a suppurative.  The mesentery of the appendix was transected with the Harmonic scalpel.  The base was transected with the 4.5 cm white load stapler.  Good stump present.  Hemostasis noted.  Appendix was placed into a [place] and brought out through the umbilicus.  Umbilicus was closed with a figure of 8 of 0 vicryl.  4-0 monocryl on the skin with Dermabond.    The patient tolerated the procedure well and was taken to the PACU in stable condition.     Joseph B. Daphine DeutscherMartin, MD, [facility], [place] [phone]

## 2017-05-13 NOTE — ED Triage Notes (Signed)
Pt complains of RLQ abd pain since waking up at 12AM this morning. Pt went to bed feeling fine. Pt states he vomited twice. Pt has hx of colitis, but states he has not had pain from colitis in 15 years. Pain is worse when laying on back. Pt still has appendix.

## 2017-05-13 NOTE — H&P (Signed)
Joseph Lucero is an 36 y.o. male.    Chief Complaint: Patient woke up around midnight with abdominal pain which is worse laying on his back.   HPI: Patient reports she was fine last night when he went to bed but woke up around 1230 with a sharp stabbing pain in the right lower quadrant associated with nausea and 2 episodes of vomiting.  He presented to the ED for evaluation.  Workup in the ED shows he is afebrile vital signs are stable he was a little tachycardic on admission.  CMP is normal except for glucose of 116, and an alk phos of 140.  WBC is 21.4.  Hemoglobin 16.4, hematocrit 46.3.  CT of the abdomen: He has multiple sigmoid diverticulum without diverticulitis.  The appendix was dilated with a maximum transverse diameter of 15 mm.  There is mild enhancement of the appendiceal wall stranding along the course of the appendix there is no fluid collection abscess and no evidence of perforation.  We are asked to see.  Past Medical History:  Diagnosis Date  . IBS (irritable bowel syndrome)     Past Surgical History:  Procedure Laterality Date  . COLONOSCOPY    . WISDOM TOOTH EXTRACTION      Family History  Problem Relation Age of Onset  . Diabetes Father    Social History:  reports that  has never smoked. he has never used smokeless tobacco. He reports that he drinks alcohol. His drug history is not on file.  Allergies: No Known Allergies  Prior to Admission medications   Medication Sig Start Date End Date Taking? Authorizing Provider  ibuprofen (ADVIL,MOTRIN) 200 MG tablet Take 400 mg by mouth every 6 (six) hours as needed for fever, headache, mild pain, moderate pain or cramping.   Yes [provider]     Results for orders placed or performed during the hospital encounter of 05/13/17 (from the past 48 hour(s))  CBC with Differential/Platelet     Status: Abnormal   Collection Time: 05/13/17  8:41 AM  Result Value Ref Range   WBC 21.4 (H) 4.0 - 10.5 K/uL   RBC 5.18  4.22 - 5.81 MIL/uL   Hemoglobin 16.4 13.0 - 17.0 g/dL   HCT 46.3 39.0 - 52.0 %   MCV 89.4 78.0 - 100.0 fL   MCH 31.7 26.0 - 34.0 pg   MCHC 35.4 30.0 - 36.0 g/dL   RDW 12.3 11.5 - 15.5 %   Platelets 286 150 - 400 K/uL   Neutrophils Relative % 86 %   Neutro Abs 18.4 (H) 1.7 - 7.7 K/uL   Lymphocytes Relative 8 %   Lymphs Abs 1.6 0.7 - 4.0 K/uL   Monocytes Relative 6 %   Monocytes Absolute 1.4 (H) 0.1 - 1.0 K/uL   Eosinophils Relative 0 %   Eosinophils Absolute 0.0 0.0 - 0.7 K/uL   Basophils Relative 0 %   Basophils Absolute 0.0 0.0 - 0.1 K/uL  Comprehensive metabolic panel     Status: Abnormal   Collection Time: 05/13/17  8:41 AM  Result Value Ref Range   Sodium 136 135 - 145 mmol/L   Potassium 3.8 3.5 - 5.1 mmol/L   Chloride 103 101 - 111 mmol/L   CO2 23 22 - 32 mmol/L   Glucose, Bld 116 (H) 65 - 99 mg/dL   BUN 18 6 - 20 mg/dL   Creatinine, Ser 0.84 0.61 - 1.24 mg/dL   Calcium 9.3 8.9 - 10.3 mg/dL   Total Protein  7.7 6.5 - 8.1 g/dL   Albumin 4.3 3.5 - 5.0 g/dL   AST 31 15 - 41 U/L   ALT 54 17 - 63 U/L   Alkaline Phosphatase 140 (H) 38 - 126 U/L   Total Bilirubin 0.7 0.3 - 1.2 mg/dL   GFR calc non Af Amer >60 >60 mL/min   GFR calc Af Amer >60 >60 mL/min    Comment: (NOTE) The eGFR has been calculated using the CKD EPI equation. This calculation has not been validated in all clinical situations. eGFR's persistently <60 mL/min signify possible Chronic Kidney Disease.    Anion gap 10 5 - 15  Lipase, blood     Status: None   Collection Time: 05/13/17  8:41 AM  Result Value Ref Range   Lipase 22 11 - 51 U/L   Ct Abdomen Pelvis W Contrast  Result Date: 05/13/2017 CLINICAL DATA:  Right lower quadrant pain with nausea and vomiting EXAM: CT ABDOMEN AND PELVIS WITH CONTRAST TECHNIQUE: Multidetector CT imaging of the abdomen and pelvis was performed using the standard protocol following bolus administration of intravenous contrast. CONTRAST:  127m ISOVUE-300 IOPAMIDOL  (ISOVUE-300) INJECTION 61% COMPARISON:  None. FINDINGS: Lower chest: There is slight bibasilar atelectasis. Lung bases otherwise are clear. Hepatobiliary: There is hepatic steatosis. No focal liver lesions are evident. Gallbladder wall is not appreciably thickened. There is no biliary duct dilatation. Pancreas: No pancreatic mass or inflammatory focus. Spleen: Spleen measures 14.0 x 14.1 x 5.5 cm with a measured splenic volume of 543 cubic cm. No splenic lesions are evident. Adrenals/Urinary Tract: Adrenals appear normal bilaterally. Kidneys bilaterally show no evident mass or hydronephrosis on either side. There is no renal or ureteral calculus on either side. Urinary bladder is midline with wall thickness within normal limits. Stomach/Bowel: There are multiple sigmoid diverticulum without diverticulitis. There also scattered diverticula in the descending colon. There is no bowel wall o thickening. No bowel obstruction. No free air or portal venous air. Vascular/Lymphatic: No abdominal aortic aneurysm. No vascular lesions are evident. There is no adenopathy in the abdomen or pelvis. Reproductive: Prostate and seminal vesicles are normal in size and contour. No pelvic mass evident. Other: The appendix is dilated with a maximum transverse diameter of 15 mm. There is mild enhancement of the appendiceal wall. There is stranding along the course of the appendix. There is no fluid collection or abscess. No perforation. No abscess or ascites is evident in the abdomen or pelvis. There is a minimal ventral hernia containing only fat. Musculoskeletal: There are no blastic or lytic bone lesions. There is no intramuscular or abdominal wall lesion. IMPRESSION: 1.  Findings indicative of acute appendicitis. Appendix: Location: Right lower quadrant immediately anterior to the psoas muscle with appendix transverse in orientation with the tip near the distal right common iliac artery. Diameter: 15 mm Appendicolith: None Mucosal  hyper-enhancement: Mild diffusely Extraluminal gas: No Periappendiceal collection: None. There is stranding in the soft tissues along the course of the appendix. 2. Multiple descending colon and sigmoid diverticula without diverticulitis. No bowel obstruction. No abscess. 3.  No renal or ureteral calculus.  No hydronephrosis. 4.  Prominent spleen of uncertain etiology. 5.  Hepatic steatosis. 6.  Rather minimal ventral hernia containing only fat. Critical Value/emergent results were called by telephone at the time of interpretation on 05/13/2017 at 10:54 am to Dr. DJulianne Rice, who verbally acknowledged these results. Electronically Signed   By: WLowella GripIII M.D.   On: 05/13/2017 10:55  Review of Systems  Constitutional: Negative.   HENT: Negative.   Eyes: Negative.   Respiratory: Negative.   Cardiovascular: Negative.   Gastrointestinal: Positive for abdominal pain, heartburn (occasional ), nausea and vomiting. Negative for blood in stool, constipation, diarrhea and melena.  Genitourinary: Negative.   Musculoskeletal: Negative.   Skin: Negative.   Neurological: Negative.   Endo/Heme/Allergies: Negative.   Psychiatric/Behavioral: The patient is nervous/anxious.     Blood pressure (!) 141/88, pulse 89, temperature 98.6 F (37 C), temperature source Oral, resp. rate 16, SpO2 99 %. Physical Exam  Constitutional: He is oriented to person, place, and time. He appears well-developed and well-nourished. No distress.  HENT:  Head: Normocephalic and atraumatic.  Mouth/Throat: No oropharyngeal exudate.  Eyes: Right eye exhibits no discharge. Left eye exhibits no discharge. No scleral icterus.  Pupils are equal  Neck: Normal range of motion. Neck supple. No JVD present. No tracheal deviation present. No thyromegaly present.  Cardiovascular: Normal rate, regular rhythm, normal heart sounds and intact distal pulses.  No murmur heard. Respiratory: Effort normal and breath sounds normal.  No respiratory distress. He has no wheezes. He has no rales. He exhibits no tenderness.  GI: Bowel sounds are normal. He exhibits no distension and no mass. There is tenderness (RLQ). There is no rebound and no guarding.  Musculoskeletal: He exhibits no edema or tenderness.  Lymphadenopathy:    He has no cervical adenopathy.  Neurological: He is alert and oriented to person, place, and time. No cranial nerve deficit.  Skin: Skin is warm and dry. No rash noted. He is not diaphoretic. There is erythema. No pallor.  Psychiatric: He has a normal mood and affect. His behavior is normal. Judgment and thought content normal.     Assessment/Plan Acute appendicitis  Hx of IBS - followed in [place], no meds BMI 38  Plan:  ABX, IV fluids and surgery this afternoon.      Caulder Wehner, PA-C 05/13/2017, 11:26 AM

## 2017-05-13 NOTE — Interval H&P Note (Signed)
History and Physical Interval Note:  05/13/2017 1:07 PM  Joseph Lucero  has presented today for surgery, with the diagnosis of appendicitis  The various methods of treatment have been discussed with the patient and family. After consideration of risks, benefits and other options for treatment, the patient has consented to  Procedure(s): APPENDECTOMY LAPAROSCOPIC (N/A) as a surgical intervention .  The patient's history has been reviewed, patient examined, no change in status, stable for surgery.  I have reviewed the patient's chart and labs.  Questions were answered to the patient's satisfaction.     Valarie MerinoMatthew B Trinisha Paget

## 2017-05-13 NOTE — Anesthesia Procedure Notes (Signed)
Date/Time: 05/13/2017 2:33 PM Performed by: Minerva EndsMirarchi, Ameenah Prosser M, CRNA Oxygen Delivery Method: Simple face mask Placement Confirmation: positive ETCO2 and breath sounds checked- equal and bilateral Dental Injury: Teeth and Oropharynx as per pre-operative assessment  Comments: Extubated to face mask

## 2017-05-14 ENCOUNTER — Encounter (HOSPITAL_COMMUNITY): Payer: Self-pay | Admitting: Surgery

## 2017-05-14 LAB — CBC
HCT: 41 % (ref 39.0–52.0)
Hemoglobin: 14.1 g/dL (ref 13.0–17.0)
MCH: 31.2 pg (ref 26.0–34.0)
MCHC: 34.4 g/dL (ref 30.0–36.0)
MCV: 90.7 fL (ref 78.0–100.0)
PLATELETS: 265 10*3/uL (ref 150–400)
RBC: 4.52 MIL/uL (ref 4.22–5.81)
RDW: 12.4 % (ref 11.5–15.5)
WBC: 13.9 10*3/uL — ABNORMAL HIGH (ref 4.0–10.5)

## 2017-05-14 LAB — BASIC METABOLIC PANEL
Anion gap: 8 (ref 5–15)
BUN: 12 mg/dL (ref 6–20)
CALCIUM: 9.1 mg/dL (ref 8.9–10.3)
CO2: 26 mmol/L (ref 22–32)
CREATININE: 0.81 mg/dL (ref 0.61–1.24)
Chloride: 103 mmol/L (ref 101–111)
GFR calc Af Amer: >60 mL/min (ref >60)
GLUCOSE: 161 mg/dL — AB (ref 65–99)
POTASSIUM: 4.1 mmol/L (ref 3.5–5.1)
SODIUM: 137 mmol/L (ref 135–145)

## 2017-05-14 MED ORDER — HYDROCODONE-ACETAMINOPHEN 5-325 MG PO TABS: 1.0000 | ORAL_TABLET | ORAL | 0 refills | Status: AC | PRN

## 2017-05-14 MED ORDER — ACETAMINOPHEN 325 MG PO TABS: 650.0000 mg | ORAL_TABLET | Freq: Three times a day (TID) | ORAL | 2 refills | Status: AC | PRN

## 2017-05-14 NOTE — Discharge Summary (Signed)
[facility] Discharge Summary   Patient ID: Joseph Lucero [place] MRN: [id number] DOB/AGE: 38/10/1980 38 y.o.  Admit date: 05/13/2017 Discharge date: 05/14/2017  Discharge Diagnosis Patient Active Problem List   Diagnosis Date Noted  . Acute appendicitis 05/13/2017  . Status post laparoscopic appendectomy 05/13/2017   Imaging: Ct Abdomen Pelvis W Contrast  Result Date: 05/13/2017 CLINICAL DATA:  Right lower quadrant pain with nausea and vomiting EXAM: CT ABDOMEN AND PELVIS WITH CONTRAST TECHNIQUE: Multidetector CT imaging of the abdomen and pelvis was performed using the standard protocol following bolus administration of intravenous contrast. CONTRAST:  100mL ISOVUE-300 IOPAMIDOL (ISOVUE-300) INJECTION 61% COMPARISON:  None. FINDINGS: Lower chest: There is slight bibasilar atelectasis. Lung bases otherwise are clear. Hepatobiliary: There is hepatic steatosis. No focal liver lesions are evident. Gallbladder wall is not appreciably thickened. There is no biliary duct dilatation. Pancreas: No pancreatic mass or inflammatory focus. Spleen: Spleen measures 14.0 x 14.1 x 5.5 cm with a measured splenic volume of 543 cubic cm. No splenic lesions are evident. Adrenals/Urinary Tract: Adrenals appear normal bilaterally. Kidneys bilaterally show no evident mass or hydronephrosis on either side. There is no renal or ureteral calculus on either side. Urinary bladder is midline with wall thickness within normal limits. Stomach/Bowel: There are multiple sigmoid diverticulum without diverticulitis. There also scattered diverticula in the descending colon. There is no bowel wall o thickening. No bowel obstruction. No free air or portal venous air. Vascular/Lymphatic: No abdominal aortic aneurysm. No vascular lesions are evident. There is no adenopathy in the abdomen or pelvis. Reproductive: Prostate and seminal vesicles are normal in size and contour. No pelvic mass evident. Other: The appendix is dilated with  a maximum transverse diameter of 15 mm. There is mild enhancement of the appendiceal wall. There is stranding along the course of the appendix. There is no fluid collection or abscess. No perforation. No abscess or ascites is evident in the abdomen or pelvis. There is a minimal ventral hernia containing only fat. Musculoskeletal: There are no blastic or lytic bone lesions. There is no intramuscular or abdominal wall lesion. IMPRESSION: 1.  Findings indicative of acute appendicitis. Appendix: Location: Right lower quadrant immediately anterior to the psoas muscle with appendix transverse in orientation with the tip near the distal right common iliac artery. Diameter: 15 mm Appendicolith: None Mucosal hyper-enhancement: Mild diffusely Extraluminal gas: No Periappendiceal collection: None. There is stranding in the soft tissues along the course of the appendix. 2. Multiple descending colon and sigmoid diverticula without diverticulitis. No bowel obstruction. No abscess. 3.  No renal or ureteral calculus.  No hydronephrosis. 4.  Prominent spleen of uncertain etiology. 5.  Hepatic steatosis. 6.  Rather minimal ventral hernia containing only fat. Critical Value/emergent results were called by telephone at the time of interpretation on 05/13/2017 at 10:54 am to Dr. Loren RacerAVID YELVERTON , who verbally acknowledged these results. Electronically Signed   By: Bretta BangWilliam  Woodruff III M.D.   On: 05/13/2017 10:55    Procedures  Dr. Luretha MurphyMatthew Martin (05/13/17) - Laparoscopic Appendectomy  Hospital Course:  36 y/o M w/ PMH IBS and obesity [facility] onset abdominal pain.  Workup showed acute appendicitis (CT above) with leukocytosis of 21,400.  Patient was admitted and underwent procedure listed above.  Tolerated procedure well and was transferred to the floor.  Diet was advanced as tolerated.  On POD#1, the patient was voiding well, tolerating diet, ambulating well, pain well controlled, vital signs  stable, incisions c/d/i and felt stable for  discharge home.  Patient will follow up in our office as below and knows to call with questions or concerns.   Physical Exam: General:  Alert, NAD, pleasant, comfortable Abd:  Soft, ND, appropriately tender, incisions C/D/I  Allergies as of 05/14/2017   No Known Allergies     Medication List    TAKE these medications   acetaminophen 325 MG tablet Commonly known as:  TYLENOL Take 2 tablets (650 mg total) by mouth every 8 (eight) hours as needed.   HYDROcodone-acetaminophen 5-325 MG tablet Commonly known as:  NORCO/VICODIN Take 1 tablet by mouth every 4 (four) hours as needed for moderate pain.   ibuprofen 200 MG tablet Commonly known as:  ADVIL,MOTRIN Take 400 mg by mouth every 6 (six) hours as needed for fever, headache, mild pain, moderate pain or cramping.       Follow-up Information    [facility]   Specialty:  General Surgery Why:  Our office is scheduling you for a follow up appointment in 2-3 weeks. Please call to confirm appointment time. Be at the office 30 minutes early for check in. Bring photo ID and insurance information.   Contact information: [address] [place] [address] [phone]           Signed: Hosie SpangleElizabeth Shaunessy Dobratz, [facility] 05/14/2017, 9:15 AM Pager: [phone] Consults: [phone] Mon-Fri 7:00 am-4:30 pm Sat-Sun 7:00 am-11:30 am

## 2017-05-14 NOTE — Progress Notes (Signed)
Discharge instructions discussed with patient, verbalized agreement and understanding, prescripton given to family

## 2017-06-08 ENCOUNTER — Encounter (HOSPITAL_COMMUNITY): Payer: Self-pay | Admitting: Surgery

## 2017-06-08 NOTE — Addendum Note (Signed)
Addendum  created 06/08/17 1446 by Brycin Kille, MD   Intraprocedure Event edited, Intraprocedure Staff edited    

## 2018-10-12 DIAGNOSIS — F411 Generalized anxiety disorder: Secondary | ICD-10-CM | POA: Diagnosis not present

## 2018-10-12 DIAGNOSIS — I10 Essential (primary) hypertension: Secondary | ICD-10-CM | POA: Diagnosis not present

## 2018-11-09 DIAGNOSIS — N529 Male erectile dysfunction, unspecified: Secondary | ICD-10-CM | POA: Diagnosis not present

## 2018-11-09 DIAGNOSIS — I10 Essential (primary) hypertension: Secondary | ICD-10-CM | POA: Diagnosis not present

## 2018-11-09 DIAGNOSIS — F411 Generalized anxiety disorder: Secondary | ICD-10-CM | POA: Diagnosis not present

## 2018-12-06 DIAGNOSIS — R5383 Other fatigue: Secondary | ICD-10-CM | POA: Diagnosis not present

## 2018-12-06 DIAGNOSIS — F411 Generalized anxiety disorder: Secondary | ICD-10-CM | POA: Diagnosis not present

## 2018-12-06 DIAGNOSIS — I10 Essential (primary) hypertension: Secondary | ICD-10-CM | POA: Diagnosis not present

## 2018-12-06 DIAGNOSIS — G4700 Insomnia, unspecified: Secondary | ICD-10-CM | POA: Diagnosis not present

## 2019-01-14 DIAGNOSIS — Z0389 Encounter for observation for other suspected diseases and conditions ruled out: Secondary | ICD-10-CM | POA: Diagnosis not present

## 2019-01-14 DIAGNOSIS — Z1159 Encounter for screening for other viral diseases: Secondary | ICD-10-CM | POA: Diagnosis not present

## 2019-01-14 DIAGNOSIS — F411 Generalized anxiety disorder: Secondary | ICD-10-CM | POA: Diagnosis not present

## 2019-01-14 DIAGNOSIS — I10 Essential (primary) hypertension: Secondary | ICD-10-CM | POA: Diagnosis not present

## 2019-01-27 DIAGNOSIS — I10 Essential (primary) hypertension: Secondary | ICD-10-CM | POA: Diagnosis not present

## 2019-01-27 DIAGNOSIS — F411 Generalized anxiety disorder: Secondary | ICD-10-CM | POA: Diagnosis not present

## 2019-01-27 DIAGNOSIS — Z0389 Encounter for observation for other suspected diseases and conditions ruled out: Secondary | ICD-10-CM | POA: Diagnosis not present

## 2019-05-04 DIAGNOSIS — I10 Essential (primary) hypertension: Secondary | ICD-10-CM | POA: Diagnosis not present

## 2019-05-04 DIAGNOSIS — J45990 Exercise induced bronchospasm: Secondary | ICD-10-CM | POA: Diagnosis not present

## 2019-05-04 DIAGNOSIS — F411 Generalized anxiety disorder: Secondary | ICD-10-CM | POA: Diagnosis not present

## 2019-05-04 DIAGNOSIS — G4700 Insomnia, unspecified: Secondary | ICD-10-CM | POA: Diagnosis not present

## 2019-06-22 DIAGNOSIS — I10 Essential (primary) hypertension: Secondary | ICD-10-CM | POA: Diagnosis not present

## 2019-06-22 DIAGNOSIS — F411 Generalized anxiety disorder: Secondary | ICD-10-CM | POA: Diagnosis not present

## 2019-06-22 DIAGNOSIS — G4700 Insomnia, unspecified: Secondary | ICD-10-CM | POA: Diagnosis not present

## 2019-06-22 DIAGNOSIS — J45990 Exercise induced bronchospasm: Secondary | ICD-10-CM | POA: Diagnosis not present

## 2019-07-21 DIAGNOSIS — G4700 Insomnia, unspecified: Secondary | ICD-10-CM | POA: Diagnosis not present

## 2019-07-21 DIAGNOSIS — F411 Generalized anxiety disorder: Secondary | ICD-10-CM | POA: Diagnosis not present

## 2019-07-21 DIAGNOSIS — I10 Essential (primary) hypertension: Secondary | ICD-10-CM | POA: Diagnosis not present

## 2019-08-04 DIAGNOSIS — Z0184 Encounter for antibody response examination: Secondary | ICD-10-CM | POA: Diagnosis not present

## 2019-08-04 DIAGNOSIS — Z125 Encounter for screening for malignant neoplasm of prostate: Secondary | ICD-10-CM | POA: Diagnosis not present

## 2019-08-04 DIAGNOSIS — Z0000 Encounter for general adult medical examination without abnormal findings: Secondary | ICD-10-CM | POA: Diagnosis not present

## 2019-08-09 DIAGNOSIS — Z20828 Contact with and (suspected) exposure to other viral communicable diseases: Secondary | ICD-10-CM | POA: Diagnosis not present

## 2019-08-11 DIAGNOSIS — R739 Hyperglycemia, unspecified: Secondary | ICD-10-CM | POA: Diagnosis not present

## 2019-08-11 DIAGNOSIS — Z0000 Encounter for general adult medical examination without abnormal findings: Secondary | ICD-10-CM | POA: Diagnosis not present

## 2019-08-11 DIAGNOSIS — Z6839 Body mass index (BMI) 39.0-39.9, adult: Secondary | ICD-10-CM | POA: Diagnosis not present

## 2019-08-11 IMAGING — CT CT ABD-PELV W/ CM
2 of 4 series · 15 of 46 positions shown, 17 images · IV contrast (ISOVUE)
Comparison: None.

CLINICAL DATA: Right lower quadrant pain with nausea and vomiting

EXAM:
CT ABDOMEN AND PELVIS WITH CONTRAST
TECHNIQUE: Multidetector CT imaging of the abdomen and pelvis was performed
using the standard protocol following bolus administration of
intravenous contrast.
CONTRAST:  100mL HUMXLK-988 IOPAMIDOL (HUMXLK-988) INJECTION 61%

[Series 2: axial st · axial · 0.98mm/px · z∈[+1258,+1753]mm · 12 of 113 slices shown, 14 images]
[im 7/113  soft-tissue]
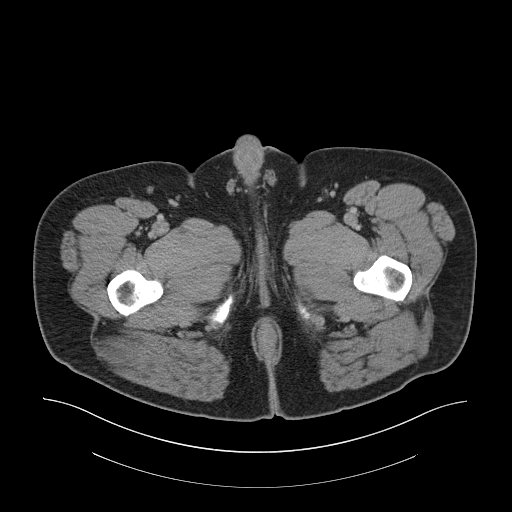
[im 7/113  bone]
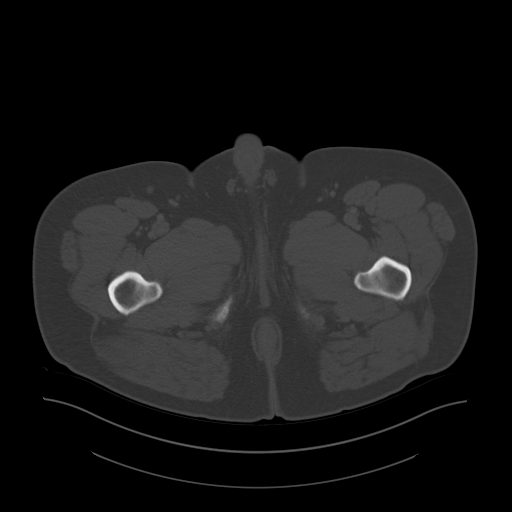
[im 19/113  soft-tissue]
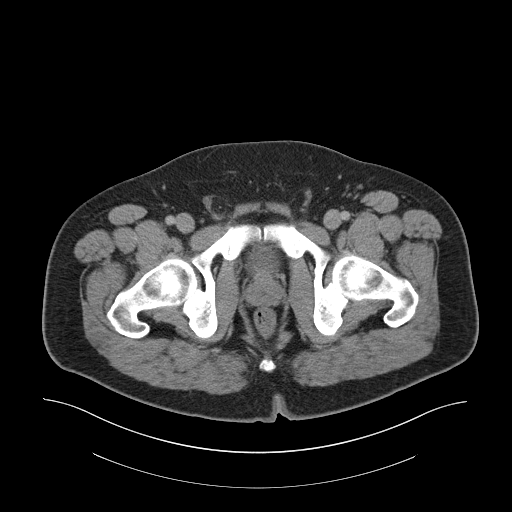
[im 25/113  soft-tissue]
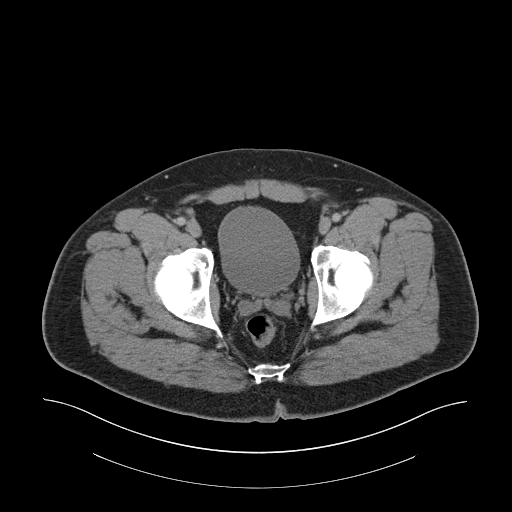
[im 32/113  soft-tissue]
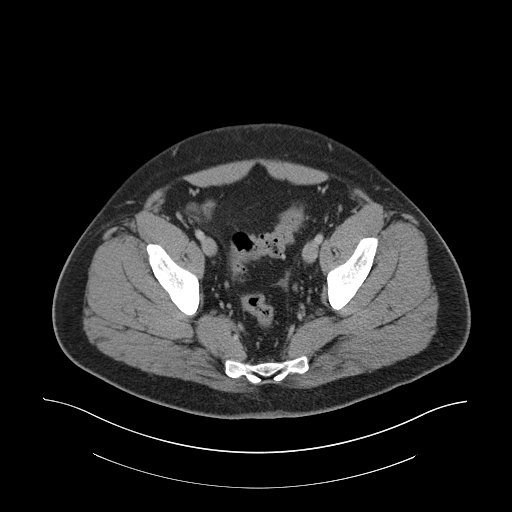
[im 44/113  soft-tissue]
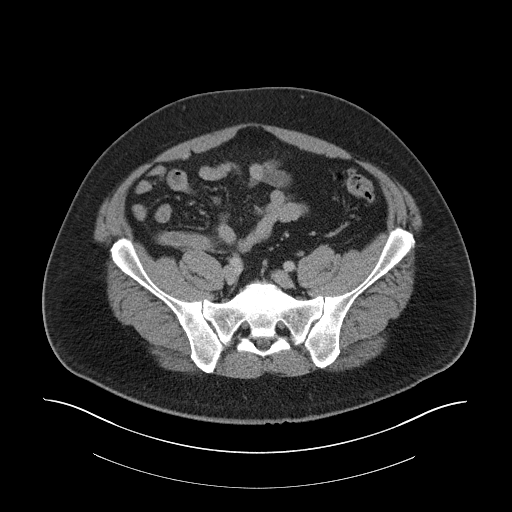
[im 50/113  soft-tissue]
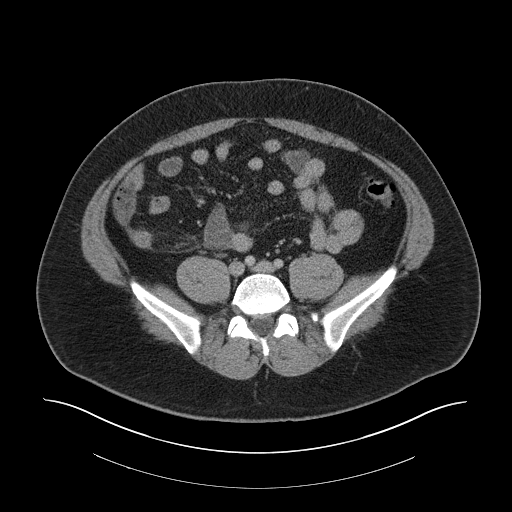
[im 63/113  soft-tissue]
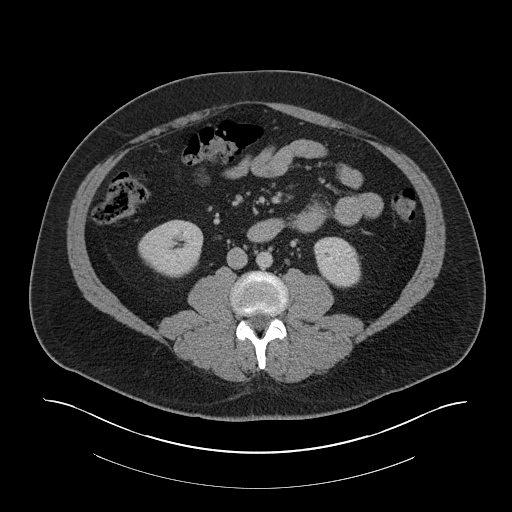
[im 69/113  soft-tissue]
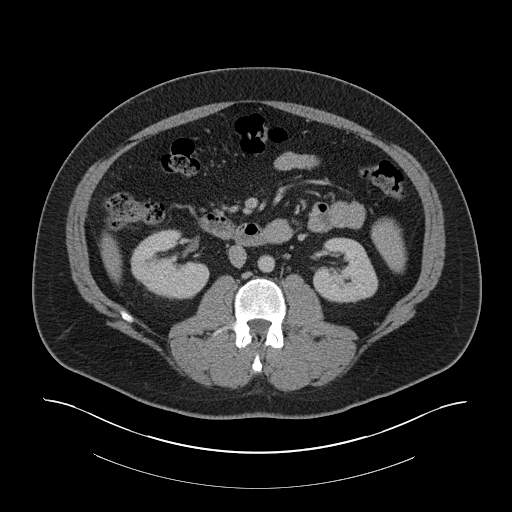
[im 81/113  soft-tissue]
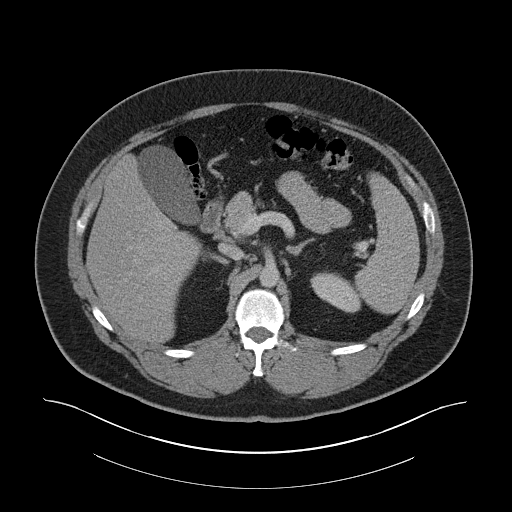
[im 81/113  bone]
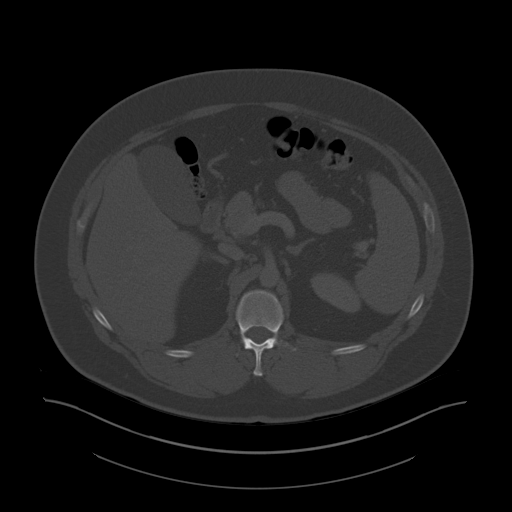
[im 88/113  soft-tissue]
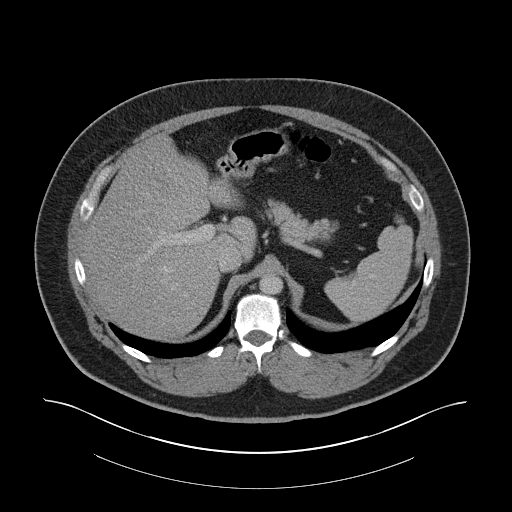
[im 94/113  soft-tissue]
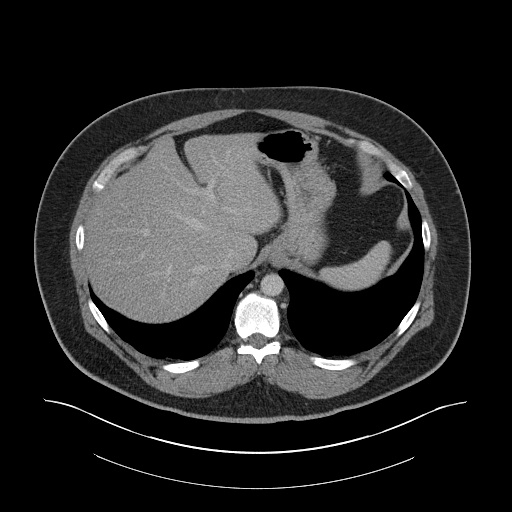
[im 106/113  soft-tissue]
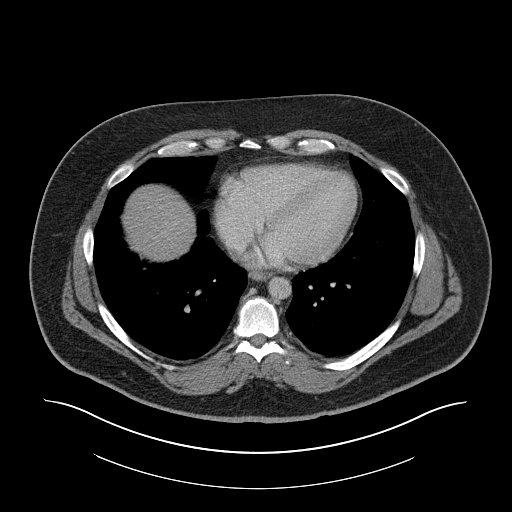

[Series 4: coronal st · coronal · 0.87mm/px · 3 of 117 slices shown]
[im 39/117  soft-tissue]
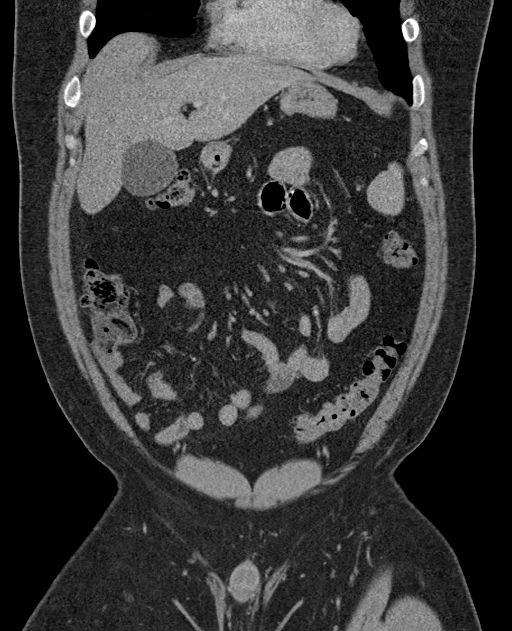
[im 52/117  soft-tissue]
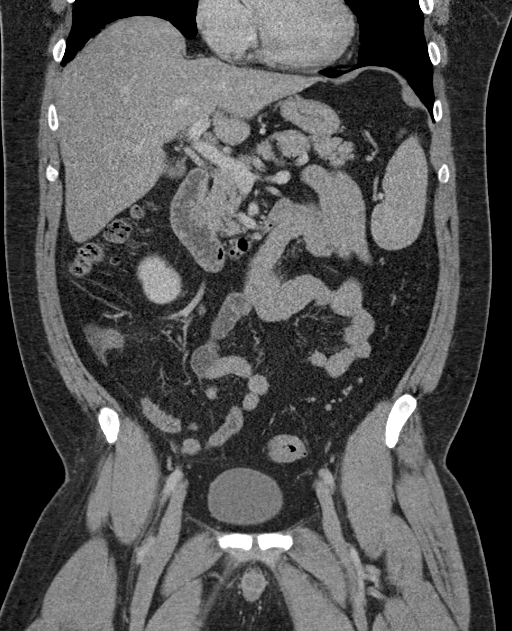
[im 65/117  soft-tissue]
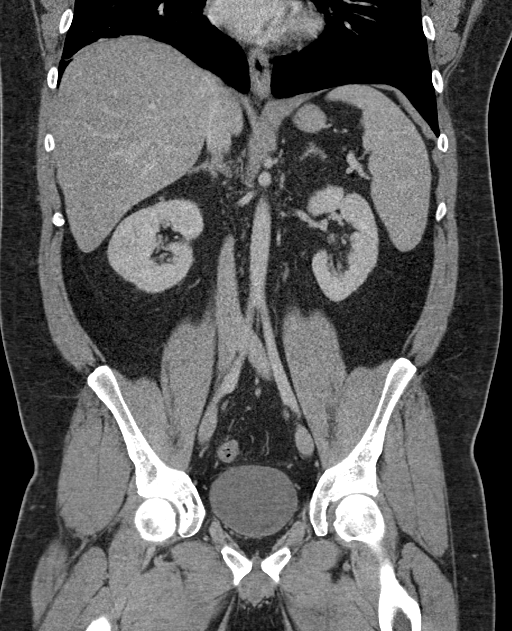

[15 of 46 positions shown; findings below may reference images not displayed]

FINDINGS: Lower chest: There is slight bibasilar atelectasis. Lung bases
otherwise are clear.

Hepatobiliary: There is hepatic steatosis. No focal liver lesions
are evident. Gallbladder wall is not appreciably thickened. There is
no biliary duct dilatation.

Pancreas: No pancreatic mass or inflammatory focus.

Spleen: Spleen measures 14.0 x 14.1 x 5.5 cm with a measured splenic
volume of 543 cubic cm. No splenic lesions are evident.

Adrenals/Urinary Tract: Adrenals appear normal bilaterally. Kidneys
bilaterally show no evident mass or hydronephrosis on either side.
There is no renal or ureteral calculus on either side. Urinary
bladder is midline with wall thickness within normal limits.

Stomach/Bowel: There are multiple sigmoid diverticulum without
diverticulitis. There also scattered diverticula in the descending
colon. There is no bowel wall o thickening. No bowel obstruction. No
free air or portal venous air.

Vascular/Lymphatic: No abdominal aortic aneurysm. No vascular
lesions are evident. There is no adenopathy in the abdomen or
pelvis.

Reproductive: Prostate and seminal vesicles are normal in size and
contour. No pelvic mass evident.

Other: The appendix is dilated with a maximum transverse diameter of
15 mm. There is mild enhancement of the appendiceal wall. There is
stranding along the course of the appendix. There is no fluid
collection or abscess. No perforation.

No abscess or ascites is evident in the abdomen or pelvis. There is
a minimal ventral hernia containing only fat.

Musculoskeletal: There are no blastic or lytic bone lesions. There
is no intramuscular or abdominal wall lesion.
IMPRESSION: 1.  Findings indicative of acute appendicitis.

Appendix: Location: Right lower quadrant immediately anterior to the
psoas muscle with appendix transverse in orientation with the tip
near the distal right common iliac artery.

Diameter: 15 mm

Appendicolith: None

Mucosal hyper-enhancement: Mild diffusely

Extraluminal gas: No

Periappendiceal collection: None. There is stranding in the soft
tissues along the course of the appendix.

2. Multiple descending colon and sigmoid diverticula without
diverticulitis. No bowel obstruction. No abscess.

3.  No renal or ureteral calculus.  No hydronephrosis.

4.  Prominent spleen of uncertain etiology.

5.  Hepatic steatosis.

6.  Rather minimal ventral hernia containing only fat.

Critical Value/emergent results were called by telephone at the time
of interpretation on 05/13/2017 at [DATE] to Dr. PINEAH DOPEKIDO ,
who verbally acknowledged these results.

## 2019-10-30 DIAGNOSIS — Z20822 Contact with and (suspected) exposure to covid-19: Secondary | ICD-10-CM | POA: Diagnosis not present

## 2019-10-30 DIAGNOSIS — R1084 Generalized abdominal pain: Secondary | ICD-10-CM | POA: Diagnosis not present

## 2019-10-30 DIAGNOSIS — K5900 Constipation, unspecified: Secondary | ICD-10-CM | POA: Diagnosis not present

## 2019-11-22 DIAGNOSIS — N410 Acute prostatitis: Secondary | ICD-10-CM | POA: Diagnosis not present

## 2019-11-22 DIAGNOSIS — R1084 Generalized abdominal pain: Secondary | ICD-10-CM | POA: Diagnosis not present

## 2019-12-07 DIAGNOSIS — F411 Generalized anxiety disorder: Secondary | ICD-10-CM | POA: Diagnosis not present

## 2019-12-07 DIAGNOSIS — E782 Mixed hyperlipidemia: Secondary | ICD-10-CM | POA: Diagnosis not present

## 2019-12-07 DIAGNOSIS — E1169 Type 2 diabetes mellitus with other specified complication: Secondary | ICD-10-CM | POA: Diagnosis not present

## 2019-12-07 DIAGNOSIS — I10 Essential (primary) hypertension: Secondary | ICD-10-CM | POA: Diagnosis not present

## 2019-12-27 DIAGNOSIS — N410 Acute prostatitis: Secondary | ICD-10-CM | POA: Diagnosis not present

## 2020-05-29 DIAGNOSIS — F411 Generalized anxiety disorder: Secondary | ICD-10-CM | POA: Diagnosis not present

## 2020-05-29 DIAGNOSIS — I10 Essential (primary) hypertension: Secondary | ICD-10-CM | POA: Diagnosis not present

## 2020-05-29 DIAGNOSIS — J45990 Exercise induced bronchospasm: Secondary | ICD-10-CM | POA: Diagnosis not present

## 2020-05-29 DIAGNOSIS — G4700 Insomnia, unspecified: Secondary | ICD-10-CM | POA: Diagnosis not present

## 2020-07-04 DIAGNOSIS — Z111 Encounter for screening for respiratory tuberculosis: Secondary | ICD-10-CM | POA: Diagnosis not present

## 2020-07-06 DIAGNOSIS — Z111 Encounter for screening for respiratory tuberculosis: Secondary | ICD-10-CM | POA: Diagnosis not present

## 2020-07-11 DIAGNOSIS — Z021 Encounter for pre-employment examination: Secondary | ICD-10-CM | POA: Diagnosis not present

## 2020-07-11 DIAGNOSIS — R1030 Lower abdominal pain, unspecified: Secondary | ICD-10-CM | POA: Diagnosis not present

## 2020-07-11 DIAGNOSIS — K529 Noninfective gastroenteritis and colitis, unspecified: Secondary | ICD-10-CM | POA: Diagnosis not present

## 2020-07-11 DIAGNOSIS — Z23 Encounter for immunization: Secondary | ICD-10-CM | POA: Diagnosis not present

## 2020-08-28 DIAGNOSIS — J309 Allergic rhinitis, unspecified: Secondary | ICD-10-CM | POA: Diagnosis not present

## 2020-08-28 DIAGNOSIS — J4520 Mild intermittent asthma, uncomplicated: Secondary | ICD-10-CM | POA: Diagnosis not present

## 2020-08-28 DIAGNOSIS — F411 Generalized anxiety disorder: Secondary | ICD-10-CM | POA: Diagnosis not present

## 2020-08-28 DIAGNOSIS — I10 Essential (primary) hypertension: Secondary | ICD-10-CM | POA: Diagnosis not present

## 2020-10-08 DIAGNOSIS — F411 Generalized anxiety disorder: Secondary | ICD-10-CM | POA: Diagnosis not present

## 2020-10-08 DIAGNOSIS — I10 Essential (primary) hypertension: Secondary | ICD-10-CM | POA: Diagnosis not present

## 2020-10-08 DIAGNOSIS — F439 Reaction to severe stress, unspecified: Secondary | ICD-10-CM | POA: Diagnosis not present

## 2020-10-08 DIAGNOSIS — R202 Paresthesia of skin: Secondary | ICD-10-CM | POA: Diagnosis not present

## 2020-12-06 DIAGNOSIS — F331 Major depressive disorder, recurrent, moderate: Secondary | ICD-10-CM | POA: Diagnosis not present

## 2020-12-06 DIAGNOSIS — F411 Generalized anxiety disorder: Secondary | ICD-10-CM | POA: Diagnosis not present

## 2020-12-06 DIAGNOSIS — E782 Mixed hyperlipidemia: Secondary | ICD-10-CM | POA: Diagnosis not present

## 2020-12-06 DIAGNOSIS — I10 Essential (primary) hypertension: Secondary | ICD-10-CM | POA: Diagnosis not present

## 2022-06-12 DIAGNOSIS — I10 Essential (primary) hypertension: Secondary | ICD-10-CM | POA: Diagnosis not present

## 2022-06-12 DIAGNOSIS — F411 Generalized anxiety disorder: Secondary | ICD-10-CM | POA: Diagnosis not present

## 2022-06-12 DIAGNOSIS — E559 Vitamin D deficiency, unspecified: Secondary | ICD-10-CM | POA: Diagnosis not present

## 2022-06-12 DIAGNOSIS — E785 Hyperlipidemia, unspecified: Secondary | ICD-10-CM | POA: Diagnosis not present

## 2022-08-21 DIAGNOSIS — Z125 Encounter for screening for malignant neoplasm of prostate: Secondary | ICD-10-CM | POA: Diagnosis not present

## 2022-08-21 DIAGNOSIS — Z0000 Encounter for general adult medical examination without abnormal findings: Secondary | ICD-10-CM | POA: Diagnosis not present

## 2022-08-21 DIAGNOSIS — Z13220 Encounter for screening for lipoid disorders: Secondary | ICD-10-CM | POA: Diagnosis not present

## 2022-08-21 DIAGNOSIS — Z114 Encounter for screening for human immunodeficiency virus [HIV]: Secondary | ICD-10-CM | POA: Diagnosis not present

## 2022-08-28 DIAGNOSIS — Z0000 Encounter for general adult medical examination without abnormal findings: Secondary | ICD-10-CM | POA: Diagnosis not present

## 2022-08-28 DIAGNOSIS — R739 Hyperglycemia, unspecified: Secondary | ICD-10-CM | POA: Diagnosis not present

## 2022-09-04 DIAGNOSIS — E782 Mixed hyperlipidemia: Secondary | ICD-10-CM | POA: Diagnosis not present

## 2022-09-04 DIAGNOSIS — F411 Generalized anxiety disorder: Secondary | ICD-10-CM | POA: Diagnosis not present

## 2022-09-04 DIAGNOSIS — I10 Essential (primary) hypertension: Secondary | ICD-10-CM | POA: Diagnosis not present

## 2022-09-04 DIAGNOSIS — G4700 Insomnia, unspecified: Secondary | ICD-10-CM | POA: Diagnosis not present

## 2023-01-07 DIAGNOSIS — J45909 Unspecified asthma, uncomplicated: Secondary | ICD-10-CM | POA: Diagnosis not present

## 2023-01-07 DIAGNOSIS — F411 Generalized anxiety disorder: Secondary | ICD-10-CM | POA: Diagnosis not present

## 2023-01-07 DIAGNOSIS — F331 Major depressive disorder, recurrent, moderate: Secondary | ICD-10-CM | POA: Diagnosis not present

## 2023-01-07 DIAGNOSIS — G4700 Insomnia, unspecified: Secondary | ICD-10-CM | POA: Diagnosis not present

## 2023-03-12 DIAGNOSIS — L739 Follicular disorder, unspecified: Secondary | ICD-10-CM | POA: Diagnosis not present

## 2023-03-12 DIAGNOSIS — L02811 Cutaneous abscess of head [any part, except face]: Secondary | ICD-10-CM | POA: Diagnosis not present

## 2023-03-12 DIAGNOSIS — L709 Acne, unspecified: Secondary | ICD-10-CM | POA: Diagnosis not present

## 2023-04-02 DIAGNOSIS — G4700 Insomnia, unspecified: Secondary | ICD-10-CM | POA: Diagnosis not present

## 2023-04-02 DIAGNOSIS — I10 Essential (primary) hypertension: Secondary | ICD-10-CM | POA: Diagnosis not present

## 2023-04-02 DIAGNOSIS — F331 Major depressive disorder, recurrent, moderate: Secondary | ICD-10-CM | POA: Diagnosis not present

## 2023-04-02 DIAGNOSIS — F411 Generalized anxiety disorder: Secondary | ICD-10-CM | POA: Diagnosis not present

## 2023-08-27 DIAGNOSIS — Z125 Encounter for screening for malignant neoplasm of prostate: Secondary | ICD-10-CM | POA: Diagnosis not present

## 2023-08-27 DIAGNOSIS — Z13220 Encounter for screening for lipoid disorders: Secondary | ICD-10-CM | POA: Diagnosis not present

## 2023-08-27 DIAGNOSIS — Z0000 Encounter for general adult medical examination without abnormal findings: Secondary | ICD-10-CM | POA: Diagnosis not present

## 2023-09-03 DIAGNOSIS — E782 Mixed hyperlipidemia: Secondary | ICD-10-CM | POA: Diagnosis not present

## 2023-09-03 DIAGNOSIS — G4700 Insomnia, unspecified: Secondary | ICD-10-CM | POA: Diagnosis not present

## 2023-09-03 DIAGNOSIS — I10 Essential (primary) hypertension: Secondary | ICD-10-CM | POA: Diagnosis not present

## 2023-09-03 DIAGNOSIS — Z789 Other specified health status: Secondary | ICD-10-CM | POA: Diagnosis not present

## 2023-09-10 DIAGNOSIS — Z0000 Encounter for general adult medical examination without abnormal findings: Secondary | ICD-10-CM | POA: Diagnosis not present

## 2023-12-23 ENCOUNTER — Encounter (HOSPITAL_COMMUNITY): Payer: Self-pay

## 2023-12-23 ENCOUNTER — Emergency Department (HOSPITAL_COMMUNITY)

## 2023-12-23 ENCOUNTER — Other Ambulatory Visit: Payer: Self-pay

## 2023-12-23 ENCOUNTER — Emergency Department (HOSPITAL_COMMUNITY)
Admission: EM | Admit: 2023-12-23 | Discharge: 2023-12-23 | Disposition: A | Discharge status: Home / Self Care | Attending: Emergency Medicine | Admitting: Emergency Medicine

## 2023-12-23 DIAGNOSIS — I10 Essential (primary) hypertension: Secondary | ICD-10-CM | POA: Insufficient documentation

## 2023-12-23 DIAGNOSIS — Z79899 Other long term (current) drug therapy: Secondary | ICD-10-CM | POA: Diagnosis not present

## 2023-12-23 DIAGNOSIS — R0789 Other chest pain: Secondary | ICD-10-CM | POA: Insufficient documentation

## 2023-12-23 DIAGNOSIS — R799 Abnormal finding of blood chemistry, unspecified: Secondary | ICD-10-CM | POA: Insufficient documentation

## 2023-12-23 DIAGNOSIS — R079 Chest pain, unspecified: Secondary | ICD-10-CM

## 2023-12-23 LAB — CBC
HCT: 44.1 % (ref 39.0–52.0)
Hemoglobin: 15.4 g/dL (ref 13.0–17.0)
MCH: 31 pg (ref 26.0–34.0)
MCHC: 34.9 g/dL (ref 30.0–36.0)
MCV: 88.7 fL (ref 80.0–100.0)
Platelets: 272 K/uL (ref 150–400)
RBC: 4.97 MIL/uL (ref 4.22–5.81)
RDW: 11.9 % (ref 11.5–15.5)
WBC: 9.1 K/uL (ref 4.0–10.5)
nRBC: 0 % (ref 0.0–0.2)

## 2023-12-23 LAB — BASIC METABOLIC PANEL WITH GFR
Anion gap: 8 (ref 5–15)
BUN: 24 mg/dL — ABNORMAL HIGH (ref 6–20)
CO2: 24 mmol/L (ref 22–32)
Calcium: 10.3 mg/dL (ref 8.9–10.3)
Chloride: 109 mmol/L (ref 98–111)
Creatinine, Ser: 0.83 mg/dL (ref 0.61–1.24)
GFR, Estimated: >60 mL/min (ref >60)
Glucose, Bld: 128 mg/dL — ABNORMAL HIGH (ref 70–99)
Potassium: 4.1 mmol/L (ref 3.5–5.1)
Sodium: 141 mmol/L (ref 135–145)

## 2023-12-23 LAB — TROPONIN I (HIGH SENSITIVITY): Troponin I (High Sensitivity): 3 ng/L (ref <18)

## 2023-12-23 NOTE — Discharge Instructions (Addendum)
 It was a pleasure taking care of you today.  Based on your history, physical exam, labs, and imaging I feel you are safe for discharge.  Today your workup was overall reassuring, based on your lab work today there was no indication of a cardiac etiology for your chest pain.  Your EKG today showed no indication of acute heart attack.  Please continue to monitor your symptoms and take over-the-counter Tylenol  or ibuprofen to help with pain.  Please be sure to follow over-the-counter dosing information and to not exceed daily limits on bottles.  If you experience in the following symptoms including but not limited to fever, chills, worsening chest pain, shortness of breath, unexplained fatigue/dizziness, or other concerning symptoms please return to the emergency department or seek further medical care.  If symptoms persist or worsen recommend follow-up with primary care provider within 48 hours.  Please make your primary care provider aware of your visit today as well as findings.

## 2023-12-23 NOTE — ED Triage Notes (Signed)
 Pt reports with left sharp chest pain x 3 days. Pt states that the pain goes into his left shoulder.

## 2023-12-23 NOTE — ED Provider Notes (Signed)
 [place] [facility]GENCY DEPARTMENT AT [facility] Provider Note   CSN: [id number] Arrival date & time: 12/23/23  1021     Patient presents with: Chest Pain  Joseph Lucero is a 43 y.o. male who presents to the emergency department with a chief complaint of left-sided chest pain for approximately 4 days.  Patient states that the pain is on the left side of his chest and then wraps around underneath his left armpit to the back of his left shoulder.  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  Patient states that the pain was worse on the first day and then got a little bit better but has been steady for the past 3 days.  Patient unsure of exact exacerbating or helpful factors, however states that the pain does feel slightly better when standing up as opposed to sitting or lying down. Patient has a past medical history significant for generalized anxiety disorder, hypertension, hyperlipidemia.  Patient denies a previous cardiac history, denies history of heart attack, blood clot, or stroke.  Denies fever, chills, shortness of breath, abdominal pain.  Patient states that he did take 1 dose of aspirin at home earlier today because he was concerned this was a cardiac event, denies improvement. Denies family history of cardiac related events.     Chest Pain     Prior to Admission medications   Medication Sig Start Date End Date Taking? Authorizing Provider  HYDROcodone -acetaminophen  (NORCO/VICODIN) 5-325 MG tablet Take 1 tablet by mouth every 4 (four) hours as needed for moderate pain. 05/14/17   Augustus Almarie RAMAN, PA-C  ibuprofen (ADVIL,MOTRIN) 200 MG tablet Take 400 mg by mouth every 6 (six) hours as needed for fever, headache, mild pain, moderate pain or cramping.    [provider]    Allergies: Patient has no known allergies.     Review of Systems  Cardiovascular:  Positive for chest pain.    Updated Vital Signs BP (!) 140/101 (BP Location: Right Arm)   Pulse 71   Temp 98.2 F (36.8 C) (Oral)   Resp 18   Ht 6' 1 (1.854 m)   Wt 129.3 kg   SpO2 98%   BMI 37.60 kg/m   Physical Exam Vitals and nursing note reviewed.  Constitutional:      General: He is awake. He is not in acute distress.    Appearance: Normal appearance. He is well-developed. He is not ill-appearing, toxic-appearing or diaphoretic.  HENT:     Head: Normocephalic.  Cardiovascular:     Rate and Rhythm: Normal rate and regular rhythm.     Heart sounds: Normal heart sounds.  Pulmonary:     Effort: Pulmonary effort is normal. No tachypnea or respiratory distress.     Breath sounds: Normal breath sounds. No wheezing, rhonchi or rales.  Chest:     Chest wall: No tenderness.  Abdominal:     Palpations: Abdomen is soft.     Tenderness: There is no abdominal tenderness. There is no guarding or rebound.  Musculoskeletal:        General: Normal range of motion.     Right lower leg: No edema.     Left lower leg: No edema.  Skin:    General: Skin is warm and dry.     Capillary Refill: Capillary refill takes less than  2 seconds.  Neurological:     General: No focal deficit present.     Mental Status: He is alert and oriented to person, place, and time.     Motor: No weakness.  Psychiatric:        Mood and Affect: Mood normal.        Behavior: Behavior normal. Behavior is cooperative.     (all labs ordered are listed, but only abnormal results are displayed) Labs Reviewed  BASIC METABOLIC PANEL WITH GFR - Abnormal; Notable for the following components:      Result Value   Glucose, Bld 128 (*)    BUN 24 (*)    All other components within normal limits  CBC  TROPONIN I (HIGH SENSITIVITY)    EKG: EKG Interpretation Date/Time:  Wednesday December 23 2023 12:38:22 EDT Ventricular Rate:  79 PR Interval:  142 QRS Duration:  90 QT  Interval:  362 QTC Calculation: 415 R Axis:   89  Text Interpretation: Sinus rhythm Low voltage, precordial leads ST elev, probable normal early repol pattern No previous ECGs available Confirmed by Zackowski, Scott [phone]) on 12/23/2023 12:49:17 PM  Radiology: [facility] Chest 2 View Result Date: 12/23/2023 CLINICAL DATA:  Chest pain EXAM: CHEST - 2 VIEW COMPARISON:  None Available. FINDINGS: Normal mediastinum and cardiac silhouette. Normal pulmonary vasculature. No evidence of effusion, infiltrate, or pneumothorax. No acute bony abnormality. IMPRESSION: No acute cardiopulmonary process. Electronically Signed   By: Jackquline Boxer M.D.   On: 12/23/2023 10:35     Procedures   Medications Ordered in the ED - No data to display                                  Medical Decision Making Amount and/or Complexity of Data Reviewed Labs: ordered. Radiology: ordered.   Patient presents to the ED for concern of chest pain, this involves an extensive number of treatment options, and is a complaint that carries with it a high risk of complications and morbidity.  The differential diagnosis includes STEMI, NSTEMI, Pericarditis, pneumothorax, trauma/injury, MSK etiology, pulmonary embolism, shingles, etc.    Co morbidities that complicate the patient evaluation  Generalized anxiety disorder, hypertension, hyperlipidemia   Lab Tests:  I Ordered, and personally interpreted labs.  The pertinent results include: CBC unremarkable, BMP unremarkable other than slightly elevated BUN, troponin 3   Imaging Studies ordered:  I ordered imaging studies including chest x-ray I independently visualized and interpreted imaging which showed no acute cardiopulmonary process, no evidence of pneumothorax, no enlarged heart or enlarged aortic arch, no infiltrate or findings suggestive of pneumonia I agree with the radiologist interpretation   Cardiac Monitoring:  The patient was maintained on a cardiac monitor.   I personally viewed and interpreted the cardiac monitored which showed an underlying rhythm of: sinus rhythm   Medicines ordered and prescription drug management: I have reviewed the patients home medicines and have made adjustments as needed   Test Considered:  D-dimer, CTA: Declined at this time as patient is PERC negative, denying unilateral leg swelling, hemoptysis, recent surgery or trauma, prior PE or DVT, hormone use   Critical Interventions:  None   Problem List / ED Course:  43 year old male, left-sided chest pain for approximately 4 days, starts at left chest goes under left axillary region and then to left shoulder, no noted alleviating or exacerbating factors other than pain is better with standing Vital signs stable  other than slightly elevated blood pressure, patient states that he is diagnosed with hypertension  Heart and lungs sound normal on exam, chest pain nonreproducible, nonpleuritic, not worse with movement Patient denies significant cardiac history, PERC negative for PE, calculated heart score of 2 points, +1 for nonspecific repolarization disturbance on EKG and +1 for 1-2 risk factors due to hypertension and hyperlipidemia, putting him in the low score/low risk Labs overall reassuring, CBC, BMP unremarkable, troponin 3, declined repeat troponin at this time as patient states that chest pain has been going on for approximately 4 days Initial EKG did not crossover to computer, instructed nursing to repeat, repeat EKG showed no evidence of STEMI today, however did show nonspecific repol disturbance confirmed by attending, no previous EKG for comparison Chest x-ray shows no acute cause for chest pain today Findings today discussed with patient Patient instructed to continue to monitor symptoms and return if symptoms persist or worsen, recommend follow-up with primary care provider within 48 hours, educated that based off workup today this does not look like a cardiology  etiology of chest pain and that workup is reassuring  Patient denies previously seeing a cardiologist Based off of past medical history significant for generalized anxiety disorder, hypertension, hyperlipidemia and slightly suspicious story I feel this patient is safe for discharge.  Doubt cardiac etiology based off of workup, exam, and history today.  Encounter overall reassuring.  Heart score of 2, PERC negative.  Will discharge with return precautions and suggest follow-up with primary care provider.  Do not see a reason for referral to cardiology at this time based off presentation and risk factors.  Instructed patient to take over-the-counter Tylenol  and ibuprofen for pain relief.   Reevaluation:  After the interventions noted above, I reevaluated the patient and found that they have :stayed the same   Social Determinants of Health:  none   Dispostion:  After consideration of the diagnostic results and the patients response to treatment, I feel that the patent would benefit from discharge and follow-up outpatient as scheduled, sooner symptoms warrant.  Return precautions given.  Patient discharged.     Final diagnoses:  Chest pain, unspecified type    ED Discharge Orders     None          Janetta Terrall JULIANNA DEVONNA 12/23/23 2237    Geraldene Hamilton, MD 12/26/23 2317
